# Patient Record
Sex: Female | Born: 1960
Health system: Southern US, Community
[De-identification: ages and names within clinical notes are randomized; demographics above are authoritative.]

## PROBLEM LIST (undated history)

## (undated) DIAGNOSIS — I10 Essential (primary) hypertension: Secondary | ICD-10-CM

## (undated) DIAGNOSIS — J302 Other seasonal allergic rhinitis: Secondary | ICD-10-CM

## (undated) DIAGNOSIS — N83209 Unspecified ovarian cyst, unspecified side: Secondary | ICD-10-CM

## (undated) DIAGNOSIS — E039 Hypothyroidism, unspecified: Secondary | ICD-10-CM

## (undated) DIAGNOSIS — T4145XA Adverse effect of unspecified anesthetic, initial encounter: Secondary | ICD-10-CM

## (undated) DIAGNOSIS — R112 Nausea with vomiting, unspecified: Secondary | ICD-10-CM

## (undated) DIAGNOSIS — Z9889 Other specified postprocedural states: Secondary | ICD-10-CM

## (undated) DIAGNOSIS — Z809 Family history of malignant neoplasm, unspecified: Secondary | ICD-10-CM

## (undated) DIAGNOSIS — N993 Prolapse of vaginal vault after hysterectomy: Secondary | ICD-10-CM

## (undated) DIAGNOSIS — E079 Disorder of thyroid, unspecified: Secondary | ICD-10-CM

## (undated) DIAGNOSIS — T8859XA Other complications of anesthesia, initial encounter: Secondary | ICD-10-CM

## (undated) DIAGNOSIS — R350 Frequency of micturition: Secondary | ICD-10-CM

## (undated) DIAGNOSIS — Z973 Presence of spectacles and contact lenses: Secondary | ICD-10-CM

## (undated) DIAGNOSIS — N8110 Cystocele, unspecified: Secondary | ICD-10-CM

## (undated) DIAGNOSIS — E876 Hypokalemia: Secondary | ICD-10-CM

## (undated) DIAGNOSIS — M1990 Unspecified osteoarthritis, unspecified site: Secondary | ICD-10-CM

## (undated) DIAGNOSIS — T7840XA Allergy, unspecified, initial encounter: Secondary | ICD-10-CM

## (undated) HISTORY — DX: Family history of malignant neoplasm, unspecified: Z80.9

## (undated) HISTORY — DX: Hypokalemia: E87.6

## (undated) HISTORY — DX: Allergy, unspecified, initial encounter: T78.40XA

## (undated) HISTORY — PX: TONSILLECTOMY: SUR1361

## (undated) HISTORY — DX: Essential (primary) hypertension: I10

## (undated) HISTORY — DX: Unspecified ovarian cyst, unspecified side: N83.209

## (undated) HISTORY — DX: Disorder of thyroid, unspecified: E07.9

---

## 1985-10-09 HISTORY — PX: PILONIDAL CYST EXCISION: SHX744

## 2007-02-12 ENCOUNTER — Encounter: Admission: RE | Admit: 2007-02-12 | Discharge: 2007-02-12 | Payer: Self-pay | Admitting: *Deleted

## 2007-09-22 ENCOUNTER — Emergency Department (HOSPITAL_COMMUNITY): Admission: EM | Admit: 2007-09-22 | Discharge: 2007-09-22 | Payer: Self-pay | Admitting: Emergency Medicine

## 2009-04-17 ENCOUNTER — Emergency Department: Payer: Self-pay | Admitting: Emergency Medicine

## 2014-03-17 ENCOUNTER — Other Ambulatory Visit: Payer: Self-pay | Admitting: Family

## 2014-03-17 ENCOUNTER — Encounter (INDEPENDENT_AMBULATORY_CARE_PROVIDER_SITE_OTHER): Payer: Self-pay

## 2014-03-17 ENCOUNTER — Ambulatory Visit
Admission: RE | Admit: 2014-03-17 | Discharge: 2014-03-17 | Disposition: A | Discharge status: Home / Self Care | Payer: No Typology Code available for payment source | Source: Ambulatory Visit | Attending: Family | Admitting: Family

## 2014-03-17 DIAGNOSIS — M25519 Pain in unspecified shoulder: Secondary | ICD-10-CM

## 2015-12-02 MED FILL — HYDROCHLOROTHIAZIDE 25 MG T: 25 | 90 days supply | Qty: 90 | Fill #3

## 2015-12-21 DIAGNOSIS — H52203 Unspecified astigmatism, bilateral: Secondary | ICD-10-CM | POA: Diagnosis not present

## 2015-12-21 DIAGNOSIS — H524 Presbyopia: Secondary | ICD-10-CM | POA: Diagnosis not present

## 2015-12-21 DIAGNOSIS — H5213 Myopia, bilateral: Secondary | ICD-10-CM | POA: Diagnosis not present

## 2016-01-06 DIAGNOSIS — Z1231 Encounter for screening mammogram for malignant neoplasm of breast: Secondary | ICD-10-CM | POA: Diagnosis not present

## 2016-01-06 DIAGNOSIS — Z6837 Body mass index (BMI) 37.0-37.9, adult: Secondary | ICD-10-CM | POA: Diagnosis not present

## 2016-01-06 DIAGNOSIS — Z01419 Encounter for gynecological examination (general) (routine) without abnormal findings: Secondary | ICD-10-CM | POA: Diagnosis not present

## 2016-01-06 MED FILL — ENALAPRIL MALEATE 10 MG TAB: 10 | 90 days supply | Qty: 90 | Fill #1

## 2016-02-10 DIAGNOSIS — R7301 Impaired fasting glucose: Secondary | ICD-10-CM | POA: Diagnosis not present

## 2016-02-10 DIAGNOSIS — I10 Essential (primary) hypertension: Secondary | ICD-10-CM | POA: Diagnosis not present

## 2016-02-17 DIAGNOSIS — R7301 Impaired fasting glucose: Secondary | ICD-10-CM | POA: Diagnosis not present

## 2016-02-17 DIAGNOSIS — I10 Essential (primary) hypertension: Secondary | ICD-10-CM | POA: Diagnosis not present

## 2016-05-03 MED FILL — ENALAPRIL MALEATE 10 MG TAB: 10 | 90 days supply | Qty: 90 | Fill #0

## 2016-05-25 MED FILL — HYDROCHLOROTHIAZIDE 25 MG T: 25 | 90 days supply | Qty: 90 | Fill #0

## 2016-08-10 DIAGNOSIS — Z23 Encounter for immunization: Secondary | ICD-10-CM | POA: Diagnosis not present

## 2016-08-10 DIAGNOSIS — I10 Essential (primary) hypertension: Secondary | ICD-10-CM | POA: Diagnosis not present

## 2016-08-10 DIAGNOSIS — M25561 Pain in right knee: Secondary | ICD-10-CM | POA: Diagnosis not present

## 2016-08-10 DIAGNOSIS — R7301 Impaired fasting glucose: Secondary | ICD-10-CM | POA: Diagnosis not present

## 2016-08-18 MED FILL — MELOXICAM 15 MG TABLET: 15 | 14 days supply | Qty: 14 | Fill #0

## 2016-08-18 MED FILL — ENALAPRIL MALEATE 10 MG TAB: 10 | 90 days supply | Qty: 90 | Fill #0

## 2016-09-08 DIAGNOSIS — J069 Acute upper respiratory infection, unspecified: Secondary | ICD-10-CM | POA: Diagnosis not present

## 2016-09-08 DIAGNOSIS — B9789 Other viral agents as the cause of diseases classified elsewhere: Secondary | ICD-10-CM | POA: Diagnosis not present

## 2016-09-08 MED FILL — BENZONATATE 100 MG CAPSULE: 100 | 10 days supply | Qty: 30 | Fill #0

## 2016-09-20 MED FILL — HYDROCHLOROTHIAZIDE 25 MG T: 25 | 90 days supply | Qty: 90 | Fill #1

## 2016-11-27 MED FILL — ENALAPRIL MALEATE 10 MG TAB: 10 | 90 days supply | Qty: 90 | Fill #1

## 2017-01-02 DIAGNOSIS — H52203 Unspecified astigmatism, bilateral: Secondary | ICD-10-CM | POA: Diagnosis not present

## 2017-01-02 DIAGNOSIS — H524 Presbyopia: Secondary | ICD-10-CM | POA: Diagnosis not present

## 2017-01-02 DIAGNOSIS — H5213 Myopia, bilateral: Secondary | ICD-10-CM | POA: Diagnosis not present

## 2017-01-17 MED FILL — HYDROCHLOROTHIAZIDE 25 MG T: 25 | 90 days supply | Qty: 90 | Fill #2

## 2017-01-19 DIAGNOSIS — Z6839 Body mass index (BMI) 39.0-39.9, adult: Secondary | ICD-10-CM | POA: Diagnosis not present

## 2017-01-19 DIAGNOSIS — Z01419 Encounter for gynecological examination (general) (routine) without abnormal findings: Secondary | ICD-10-CM | POA: Diagnosis not present

## 2017-01-19 DIAGNOSIS — Z1231 Encounter for screening mammogram for malignant neoplasm of breast: Secondary | ICD-10-CM | POA: Diagnosis not present

## 2017-02-08 DIAGNOSIS — Z0000 Encounter for general adult medical examination without abnormal findings: Secondary | ICD-10-CM | POA: Diagnosis not present

## 2017-02-08 DIAGNOSIS — R7301 Impaired fasting glucose: Secondary | ICD-10-CM | POA: Diagnosis not present

## 2017-03-22 MED FILL — ENALAPRIL MALEATE 10 MG TAB: 10 | 90 days supply | Qty: 90 | Fill #2

## 2017-05-30 MED FILL — HYDROCHLOROTHIAZIDE 25 MG T: 25 | 90 days supply | Qty: 90 | Fill #0

## 2017-07-26 MED FILL — ENALAPRIL MALEATE 10 MG TAB: 10 | 90 days supply | Qty: 90 | Fill #3

## 2017-10-15 ENCOUNTER — Ambulatory Visit (INDEPENDENT_AMBULATORY_CARE_PROVIDER_SITE_OTHER): Payer: No Typology Code available for payment source | Admitting: Physician Assistant

## 2017-10-15 ENCOUNTER — Other Ambulatory Visit: Payer: Self-pay

## 2017-10-15 ENCOUNTER — Encounter: Payer: Self-pay | Admitting: Physician Assistant

## 2017-10-15 VITALS — BP 128/86 | HR 78 | Temp 98.0°F | Resp 16 | Ht 62.0 in | Wt 233.0 lb

## 2017-10-15 DIAGNOSIS — Z23 Encounter for immunization: Secondary | ICD-10-CM

## 2017-10-15 DIAGNOSIS — R600 Localized edema: Secondary | ICD-10-CM

## 2017-10-15 DIAGNOSIS — I10 Essential (primary) hypertension: Secondary | ICD-10-CM | POA: Diagnosis not present

## 2017-10-15 DIAGNOSIS — Z0000 Encounter for general adult medical examination without abnormal findings: Secondary | ICD-10-CM | POA: Diagnosis not present

## 2017-10-15 DIAGNOSIS — N83209 Unspecified ovarian cyst, unspecified side: Secondary | ICD-10-CM | POA: Diagnosis not present

## 2017-10-15 DIAGNOSIS — N814 Uterovaginal prolapse, unspecified: Secondary | ICD-10-CM

## 2017-10-15 LAB — BASIC METABOLIC PANEL WITH GFR
BUN: 18 mg/dL (ref 7–25)
CHLORIDE: 103 mmol/L (ref 98–110)
CO2: 30 mmol/L (ref 20–32)
Calcium: 9.2 mg/dL (ref 8.6–10.4)
Creat: 0.73 mg/dL (ref 0.50–1.05)
GFR, EST NON AFRICAN AMERICAN: 93 mL/min/{1.73_m2} (ref >=60)
GFR, Est African American: 108 mL/min/{1.73_m2} (ref >=60)
GLUCOSE: 90 mg/dL (ref 65–99)
POTASSIUM: 3.7 mmol/L (ref 3.5–5.3)
SODIUM: 140 mmol/L (ref 135–146)

## 2017-10-15 LAB — EXTRA LAV TOP TUBE

## 2017-10-15 MED ORDER — ENALAPRIL MALEATE 10 MG PO TABS: 10.0000 mg | ORAL_TABLET | Freq: Every day | ORAL | 2 refills | Status: DC

## 2017-10-15 MED ORDER — HYDROCHLOROTHIAZIDE 25 MG PO TABS: 25.0000 mg | ORAL_TABLET | Freq: Every day | ORAL | 2 refills | Status: DC

## 2017-10-15 MED FILL — HYDROCHLOROTHIAZIDE 25 MG T: 25 | 90 days supply | Qty: 90 | Fill #0

## 2017-10-15 MED FILL — ENALAPRIL MALEATE 10 MG TAB: 10 | 90 days supply | Qty: 90 | Fill #0

## 2017-10-15 NOTE — Progress Notes (Signed)
Patient ID: Kimberly Wall MRN: [id number], DOB: Jun 15, 1961, 57 y.o. Date of Encounter: @DATE @  Chief Complaint:  Chief Complaint  Patient presents with  . New Patient (Initial Visit)    HPI: 57 y.o. year old female  presents as a New Patient to Establish Care.  She states that her husband was already a patient here so that is one reason she is establishing care at our office.   States that she had been going to [facility] for her PCP.   Says that she would go there just once a year and then just for acute care visits.   States that they were prescribing her blood pressure medication and her medication for fluid retention.  Reviewed with her that I can see some information in the computer.  States that she was going to a gynecologist but they do not accept her current insurance so she needs referral to a different gynecologist.   States that they have been managing uterine prolapse and cyst on right ovary.  States that she also goes to [facility].  States that she wore glasses when she was younger and now wears contacts and goes to [facility] once a year.  She sees no other specialists.  Has no other significant known medical history. No specific concerns to address.  Just needs refill on her 2 medications and to get established.   Her husband is a Software engineer. She does work but is able to work from home. Reports that they have 3 children ages 55, 68, 61.  Reports that they are all living at home currently.     Past Medical History:  Diagnosis Date  . Allergy    seasonal  . Hypertension      Home Meds: Outpatient Medications Prior to Visit  Medication Sig Dispense Refill  . enalapril (VASOTEC) 10 MG tablet Take 10 mg by mouth daily.    . hydrochlorothiazide (HYDRODIURIL) 25 MG tablet Take 25 mg by mouth daily.     No facility-administered medications prior to visit.     Allergies: No Known Allergies  Social History   Socioeconomic History  . Marital  status: Married    Spouse name: Not on file  . Number of children: Not on file  . Years of education: Not on file  . Highest education level: Not on file  Social Needs  . Financial resource strain: Not on file  . Food insecurity - worry: Not on file  . Food insecurity - inability: Not on file  . Transportation needs - medical: Not on file  . Transportation needs - non-medical: Not on file  Occupational History  . Not on file  Tobacco Use  . Smoking status: Never Smoker  . Smokeless tobacco: Never Used  Substance and Sexual Activity  . Alcohol use: Yes    Comment: occassional  . Drug use: No  . Sexual activity: Not on file  Other Topics Concern  . Not on file  Social History Narrative  . Not on file    Family History  Problem Relation Age of Onset  . Arthritis Mother   . Cancer Mother   . Varicose Veins Mother   . Arthritis Father   . Diabetes Father   . Hearing loss Father   . Heart disease Father   . Hypertension Father   . Vision loss Father   . Cancer Maternal Grandmother   . Varicose Veins Maternal Grandmother   . Stroke Maternal Grandfather   .  Stroke Paternal Grandfather      Review of Systems:  See HPI for pertinent ROS. All other ROS negative.    Physical Exam: Blood pressure 128/86, pulse 78, temperature 98 F (36.7 C), temperature source Oral, resp. rate 16, height 5\' 2"  (1.575 m), weight 105.7 kg (233 lb), last menstrual period 11/15/2016, SpO2 98 %., Body mass index is 42.62 kg/m. General:  WF. Appears in no acute distress. Neck: Supple. No thyromegaly. No lymphadenopathy. No carotid bruit. Lungs: Clear bilaterally to auscultation without wheezes, rales, or rhonchi. Breathing is unlabored. Heart: RRR with S1 S2. No murmurs, rubs, or gallops. Musculoskeletal:  Strength and tone normal for age. Extremities/Skin: Warm and dry. No LE edema. Neuro: Alert and oriented X 3. Moves all extremities spontaneously. Gait is normal. CNII-XII grossly in  tact. Psych:  Responds to questions appropriately with a normal affect.     ASSESSMENT AND PLAN:  57 y.o. year old female with  1. Encounter for medical examination to establish care  2. Essential hypertension Blood Pressure is at goal/controlled.  Check lab to monitor.  Continue current medications. - BASIC METABOLIC PANEL WITH GFR - hydrochlorothiazide (HYDRODIURIL) 25 MG tablet; Take 1 tablet (25 mg total) by mouth daily.  Dispense: 90 tablet; Refill: 2 - enalapril (VASOTEC) 10 MG tablet; Take 1 tablet (10 mg total) by mouth daily.  Dispense: 90 tablet; Refill: 2  3. Bilateral lower extremity edema Her lower extremity edema is well controlled with current dose of HCTZ.  Continue this current medication the same.  Check lab to monitor. - BASIC METABOLIC PANEL WITH GFR - hydrochlorothiazide (HYDRODIURIL) 25 MG tablet; Take 1 tablet (25 mg total) by mouth daily.  Dispense: 90 tablet; Refill: 2  4. Cyst of ovary, unspecified laterality Refer to a gynecologist who is covered with her new insurance plan. - Ambulatory referral to Gynecology  5. Uterine prolapse Refer to a gynecologist who is covered with her new insurance plan. - Ambulatory referral to Gynecology  Next visit will verify whether she has had complete physical exam recently and make sure the preventive care is up-to-date. Today she reports that she knows that she did have a tetanus vaccine at [facility] and knows that she had a colonoscopy with [facility].  She states that she will obtain those records. Will f/u regarding Preventive Care once records are obtained.  Signed, [address] [place], [place], [facility] 10/15/2017 11:11 AM

## 2017-10-24 ENCOUNTER — Encounter: Payer: Self-pay | Admitting: Physician Assistant

## 2017-11-23 ENCOUNTER — Telehealth: Payer: Self-pay | Admitting: Obstetrics and Gynecology

## 2017-11-23 NOTE — Telephone Encounter (Signed)
Called and left a message for patient to call back to schedule a new patient doctor referral appointment with our office for uterine prolapse and cyst of ovary.

## 2017-11-26 ENCOUNTER — Encounter: Payer: Self-pay | Admitting: Obstetrics and Gynecology

## 2017-11-26 ENCOUNTER — Ambulatory Visit: Payer: No Typology Code available for payment source | Admitting: Obstetrics and Gynecology

## 2017-11-26 ENCOUNTER — Other Ambulatory Visit: Payer: Self-pay

## 2017-11-26 ENCOUNTER — Ambulatory Visit (INDEPENDENT_AMBULATORY_CARE_PROVIDER_SITE_OTHER): Payer: No Typology Code available for payment source | Admitting: Obstetrics and Gynecology

## 2017-11-26 ENCOUNTER — Other Ambulatory Visit (HOSPITAL_COMMUNITY)
Admission: RE | Admit: 2017-11-26 | Discharge: 2017-11-26 | Disposition: A | Discharge status: Home / Self Care | Payer: No Typology Code available for payment source | Source: Ambulatory Visit | Attending: Obstetrics and Gynecology | Admitting: Obstetrics and Gynecology

## 2017-11-26 VITALS — BP 138/82 | HR 72 | Resp 16 | Ht 62.75 in | Wt 234.0 lb

## 2017-11-26 DIAGNOSIS — Z01419 Encounter for gynecological examination (general) (routine) without abnormal findings: Secondary | ICD-10-CM

## 2017-11-26 DIAGNOSIS — N813 Complete uterovaginal prolapse: Secondary | ICD-10-CM | POA: Diagnosis not present

## 2017-11-26 DIAGNOSIS — Z86018 Personal history of other benign neoplasm: Secondary | ICD-10-CM

## 2017-11-26 DIAGNOSIS — N951 Menopausal and female climacteric states: Secondary | ICD-10-CM | POA: Diagnosis not present

## 2017-11-26 DIAGNOSIS — Z803 Family history of malignant neoplasm of breast: Secondary | ICD-10-CM | POA: Diagnosis not present

## 2017-11-26 NOTE — Progress Notes (Signed)
57 y.o. G49P0003 Married Caucasian female here for annual exam and uterine prolapse; patient was referred from PCP.   Slow voiding less than once a week.  No bladder infections.  Does feel a bulge with wiping.   Having BMs.   No menses since March 2018 and then had cycle last week.  Menses prior to March 2018 were monthly but could be lighter, heavier, shorter, or longer.  Hx fibroids.  Hot flashes are not consistent.   FH of breast cancer - maternal grandmother, and 2 maternal first cousins.  No FH uterus, ovarian, or colon cancer.  Wants genetic counseling.   Works from home.  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 Masters in counseling.   PCP:  Dena Billet, PA-C   Patient's last menstrual period was 11/13/2017.           Sexually active: Yes.    The current method of family planning is vasectomy.    Exercising: No.  The patient does not participate in regular exercise at present. Smoker:  no  Health Maintenance: Pap:  About 2017 normal per patient -- done at [facility] History of abnormal Pap:  no MMG:  About 2017 done at [facility] Colonoscopy:  2014 - normal per patient polyps removed BMD:   n/a  Result  n/a TDaP:  unsure Gardasil:   n/a HIV: donated blood in the past  Hep C: donated blood in the past Screening Labs: discuss today   reports that  has never smoked. she has never used smokeless tobacco. She reports that she drinks alcohol. She reports that she does not use drugs.  Past Medical History:  Diagnosis Date  . Allergy    seasonal  . Hypertension   . Ovarian cyst     Past Surgical History:  Procedure Laterality Date  . CYSTECTOMY  1987    Current Outpatient Medications  Medication Sig Dispense Refill  . enalapril (VASOTEC) 10 MG tablet Take 1 tablet (10 mg total) by mouth daily. 90 tablet 2  . hydrochlorothiazide (HYDRODIURIL) 25 MG tablet Take 1 tablet (25 mg total) by mouth daily. 90 tablet 2    No current facility-administered medications for this visit.     Family History  Problem Relation Age of Onset  . Arthritis Mother   . Cancer Mother        glioblastoma  . Varicose Veins Mother   . Arthritis Father   . Diabetes Father   . Hearing loss Father   . Heart disease Father   . Hypertension Father   . Vision loss Father   . Cancer Maternal Grandmother        throat  . Varicose Veins Maternal Grandmother   . Stroke Maternal Grandfather   . Stroke Paternal Grandfather   . Thyroid disease Sister   . Cancer Maternal Aunt        breast    ROS:  Pertinent items are noted in HPI.  Otherwise, a comprehensive ROS was negative.  Exam:   BP 138/82 (BP Location: Right Arm, Patient Position: Sitting, Cuff Size: Large)   Pulse 72   Resp 16   Ht 5' 2.75" (1.594 m)   Wt 234 lb (106.1 kg)   LMP 11/13/2017   BMI 41.78 kg/m     General appearance: alert, cooperative and appears stated age Head: Normocephalic, without obvious abnormality, atraumatic Neck: no adenopathy, supple, symmetrical, trachea midline and thyroid normal to inspection and palpation Lungs: clear to  auscultation bilaterally Breasts: normal appearance, no masses or tenderness, No nipple retraction or dimpling, No nipple discharge or bleeding, No axillary or supraclavicular adenopathy Heart: regular rate and rhythm Abdomen: soft, non-tender; no masses, no organomegaly Extremities: extremities normal, atraumatic, no cyanosis or edema Skin: Skin color, texture, turgor normal. No rashes or lesions Lymph nodes: Cervical, supraclavicular, and axillary nodes normal. No abnormal inguinal nodes palpated Neurologic: Grossly normal  Pelvic: External genitalia:  no lesions              Urethra:  normal appearing urethra with no masses, tenderness or lesions              Bartholins and Skenes: normal                 Vagina: normal appearing vagina with normal color and discharge, no lesions.  Third degree uterine  prolapse.                Cervix: no lesions              Pap taken: Yes.   Bimanual Exam:  Uterus:  10 week size (difficult to feel due to body habitus), mobile and non-tender              Adnexa: no mass, fullness, tenderness              Rectal exam: Yes.  .  Confirms.              Anus:  normal sphincter tone, no lesions  Chaperone was present for exam.  Assessment:   Well woman visit with normal exam. Perimenopausal female.  Third degree uterine prolapse.  History of fibroids and ovarian cyst. FH breast cancer.   Plan: Mammogram screening discussed.  Discussed 3D.  Information for [facility] given to patient.  Recommended self breast awareness. Pap and HR HPV as above. Guidelines for Calcium, Vitamin D, regular exercise program including cardiovascular and weight bearing exercise. FSH, estradiol. Routine labs with PCP.  Return for pelvic ultrasound.  We discussed care for her uterovaginal prolapse - observation, pessary, or surgical care - hysterectomy with vaginal vault suspension.  Will need urodynamic testing if she wishes to pursue surgical care.  Follow up annually and prn.   After visit summary provided.

## 2017-11-26 NOTE — Patient Instructions (Signed)

## 2017-11-27 LAB — FOLLICLE STIMULATING HORMONE: FSH: 30.4 m[IU]/mL

## 2017-11-27 LAB — ESTRADIOL: Estradiol: 11.1 pg/mL

## 2017-11-28 LAB — CYTOLOGY - PAP
DIAGNOSIS: NEGATIVE
HPV (WINDOPATH): NOT DETECTED

## 2017-11-29 ENCOUNTER — Telehealth: Payer: Self-pay | Admitting: Obstetrics and Gynecology

## 2017-11-29 ENCOUNTER — Telehealth: Payer: Self-pay

## 2017-11-29 NOTE — Telephone Encounter (Signed)
Spoke with patient. Results given. Patient verbalizes understanding. Appointment for PUS scheduled for 12/13/2017 at 8 am with 8:30 am consult with Dr.Silva. Patient verbalizes understanding. 02 recall placed.   Routing to provider for final review. Patient agreeable to disposition. Will close encounter.

## 2017-11-29 NOTE — Telephone Encounter (Signed)
Call placed to patient to review benefits for a scheduled ultrasound. Left voicemail message requesting a return call.

## 2017-11-29 NOTE — Telephone Encounter (Signed)
-----   Message from Nunzio Cobbs, MD sent at 11/28/2017  5:30 PM EST ----- Please contact patient with results.  Her [place] and estradiol levels look closer to menopause than premenopause.  I do recommend she proceed with a pelvic ultrasound.  Please schedule this with me.  I have already place an order.  Patient knows to expect this.  Her pap and HR HPV are normal and negative.  Recall - 02.

## 2017-11-29 NOTE — Telephone Encounter (Signed)
Patient returned call. Spoke with patient regarding benefit for ultrasound. Patient understood and agreeable. Patient scheduled 12/13/17 with Dr Quincy Simmonds. Patient aware of appointment date, arrival time and cancellation policy. No further questions. Ok to close

## 2017-12-03 ENCOUNTER — Telehealth: Payer: Self-pay | Admitting: Genetic Counselor

## 2017-12-03 ENCOUNTER — Encounter: Payer: Self-pay | Admitting: Genetic Counselor

## 2017-12-03 NOTE — Telephone Encounter (Signed)
10:32 am spoke with patient to schedule Genetic counseling appt. With Steele Berg for 12/20/17 @ 8:00 a.m.  Patient is aware to arrive 30 mins early and bring ID, insurance card, and list of meds.

## 2017-12-13 ENCOUNTER — Encounter: Payer: Self-pay | Admitting: Obstetrics and Gynecology

## 2017-12-13 ENCOUNTER — Ambulatory Visit (INDEPENDENT_AMBULATORY_CARE_PROVIDER_SITE_OTHER): Payer: No Typology Code available for payment source

## 2017-12-13 ENCOUNTER — Other Ambulatory Visit: Payer: Self-pay

## 2017-12-13 ENCOUNTER — Ambulatory Visit: Payer: No Typology Code available for payment source | Admitting: Obstetrics and Gynecology

## 2017-12-13 VITALS — BP 118/80 | HR 80 | Resp 16 | Wt 234.0 lb

## 2017-12-13 DIAGNOSIS — D219 Benign neoplasm of connective and other soft tissue, unspecified: Secondary | ICD-10-CM | POA: Diagnosis not present

## 2017-12-13 DIAGNOSIS — N813 Complete uterovaginal prolapse: Secondary | ICD-10-CM | POA: Diagnosis not present

## 2017-12-13 DIAGNOSIS — N950 Postmenopausal bleeding: Secondary | ICD-10-CM | POA: Diagnosis not present

## 2017-12-13 DIAGNOSIS — Z86018 Personal history of other benign neoplasm: Secondary | ICD-10-CM

## 2017-12-13 NOTE — Progress Notes (Signed)
GYNECOLOGY  VISIT   HPI: 57 y.o.   Married  Caucasian  female   G3P0003 with Patient's last menstrual period was 11/13/2017.   here for ultrasound.  Has known fibroid.  Has third degree uterine prolapse, urinary incontinence, and postmenopausal bleeding.  Last prior LMP was March 2018.  Labs on 11/26/17: [place] 30.4 E2 11.1  GYNECOLOGIC HISTORY: Patient's last menstrual period was 11/13/2017. Contraception:  Vasectomy Menopausal hormone therapy:  none Last mammogram:  About 2017 per patient done at [facility] Last pap smear:   11/26/17 Pap and HR HPV negative        OB History    Gravida Para Term Preterm AB Living   3 3 0 0 0 3   SAB TAB Ectopic Multiple Live Births   0 0 0 0 0         Patient Active Problem List   Diagnosis Date Noted  . Essential hypertension 10/15/2017  . Bilateral lower extremity edema 10/15/2017    Past Medical History:  Diagnosis Date  . Allergy    seasonal  . Hypertension   . Ovarian cyst     Past Surgical History:  Procedure Laterality Date  . CYSTECTOMY  1987    Current Outpatient Medications  Medication Sig Dispense Refill  . enalapril (VASOTEC) 10 MG tablet Take 1 tablet (10 mg total) by mouth daily. 90 tablet 2  . hydrochlorothiazide (HYDRODIURIL) 25 MG tablet Take 1 tablet (25 mg total) by mouth daily. 90 tablet 2   No current facility-administered medications for this visit.      ALLERGIES: Patient has no known allergies.  Family History  Problem Relation Age of Onset  . Arthritis Mother   . Cancer Mother        glioblastoma  . Varicose Veins Mother   . Arthritis Father   . Diabetes Father   . Hearing loss Father   . Heart disease Father   . Hypertension Father   . Vision loss Father   . Cancer Maternal Grandmother        throat  . Varicose Veins Maternal Grandmother   . Stroke Maternal Grandfather   . Stroke Paternal Grandfather   . Thyroid disease Sister   . Cancer Maternal Aunt        breast     Social History   Socioeconomic History  . Marital status: Married    Spouse name: Not on file  . Number of children: Not on file  . Years of education: Not on file  . Highest education level: Not on file  Social Needs  . Financial resource strain: Not on file  . Food insecurity - worry: Not on file  . Food insecurity - inability: Not on file  . Transportation needs - medical: Not on file  . Transportation needs - non-medical: Not on file  Occupational History  . Not on file  Tobacco Use  . Smoking status: Never Smoker  . Smokeless tobacco: Never Used  Substance and Sexual Activity  . Alcohol use: Yes    Comment: occassional  . Drug use: No  . Sexual activity: Yes    Birth control/protection: Other-see comments    Comment: Husband had Vasectomy  Other Topics Concern  . Not on file  Social History Narrative  . Not on file    ROS:  Pertinent items are noted in HPI.  PHYSICAL EXAMINATION:    BP 118/80 (BP Location: Right Arm, Patient Position: Sitting, Cuff Size: Large)  Pulse 80   Resp 16   Wt 234 lb (106.1 kg)   LMP 11/13/2017   BMI 41.78 kg/m     General appearance: alert, cooperative and appears stated age      Pelvic: External genitalia:  no lesions              Urethra:  normal appearing urethra with no masses, tenderness or lesions              Bartholins and Skenes: normal                 Vagina: normal appearing vagina with normal color and discharge, no lesions              Cervix: no lesions                Bimanual Exam:  Uterus:  normal size, contour, position, consistency, mobility, non-tender              Adnexa: no mass, fullness, tenderness     Pelvic US: Subserosal fibroid 10/5 x 6 x 6 cm. EMS - 5.81 mm Ovaries normal.  Right ovary with 9 mm follicle.  No free fluid.  EMB Consent for procedure.  Hibiclens prep.  Tenaculum to ant. Cervical lip. Pipelle to 9 cm twice.  Tissue to pathology. Minimal EBL.  No  complications.  Chaperone was present for exam.  ASSESSMENT  Postmenopausal bleeding.  Large subserosal fibroid.  Complete uterovaginal prolapse.  Stress incontinence.   PLAN  FU EMB.  Instructions given.  I discussed her fibroid, prolapse, and PMB.  I outlined a potential plan for a robotic TLH/bilateral salpingectomy, sacrocolpopexy, and TVT midurethral sling/cysto.  Written information given. Would need urodynamic testing done if wishes to proceed with surgical reconstruction.  She would need morcellation in an Alexis bag done vaginally.    An After Visit Summary was printed and given to the patient.  __15____ minutes face to face time of which over 50% was spent in counseling.

## 2017-12-13 NOTE — Patient Instructions (Signed)

## 2017-12-13 NOTE — Progress Notes (Signed)
Encounter reviewed by Dr. Brook Amundson C. Silva.  

## 2017-12-18 ENCOUNTER — Telehealth: Payer: Self-pay | Admitting: *Deleted

## 2017-12-18 NOTE — Telephone Encounter (Signed)
Call to patient. Notified of biopsy results as instructed by Dr Quincy Simmonds. Discussion of urodynamic testing and surgical planning.  Patient will wait for call from business office regarding benefits and consider recovery period.

## 2017-12-18 NOTE — Telephone Encounter (Signed)
-----   Message from Nunzio Cobbs, MD sent at 12/17/2017  5:18 PM EDT ----- Please inform patient of her EMB showing benign endometrium.  No abnormal cells were seen.  We talked about potential robotic total laparoscopic hysterectomy with sacrocolpopexy for a prolapse repair.  We may need to coordinate care further for her.  If she would like to have surgery, she will need urodynamic testing.   Cc- Lamont Snowball

## 2017-12-20 ENCOUNTER — Other Ambulatory Visit: Payer: No Typology Code available for payment source

## 2017-12-24 NOTE — Telephone Encounter (Signed)
Spoke with patient regarding benefit for urodynamic testing. Patient understood and agreeable. Patient ready to schedule. Patient scheduled on 01/01/18. Patient aware of appointment date, arrival time and cancellation policy.   Patient is aware a nurse will call her back to go over pre-testing instructions.  Routing to [employer], RN  cc: Lamont Snowball, RN  cc: Karmen Bongo, RN

## 2017-12-25 NOTE — Telephone Encounter (Signed)
Message left to return call to [facility] at [phone] to review Urodynamics Instructions.

## 2017-12-27 NOTE — Telephone Encounter (Signed)
Call to patient. Urodynamics information sheet reviewed with patient and she verbalized understanding. Nurse visit for urinalysis prior to urodynamics scheduled for Friday 12/28/17 at 0830. Patient agreeable to date and time of appointment. One week consult scheduled for Wednesday 01/09/18 at 1530. Patient agreeable to date and time of appointment. Patient aware urodynamics information sheet will be given to her at her appointment on 12/28/17.   Routing to provider for final review. Patient agreeable to disposition. Will close encounter.   CC Lamont Snowball, RN

## 2017-12-28 ENCOUNTER — Ambulatory Visit (INDEPENDENT_AMBULATORY_CARE_PROVIDER_SITE_OTHER): Payer: No Typology Code available for payment source

## 2017-12-28 VITALS — BP 138/88 | HR 76 | Ht 62.75 in | Wt 234.0 lb

## 2017-12-28 DIAGNOSIS — R32 Unspecified urinary incontinence: Secondary | ICD-10-CM | POA: Diagnosis not present

## 2017-12-28 LAB — POCT URINALYSIS DIPSTICK
Bilirubin, UA: NEGATIVE
Blood, UA: NEGATIVE
Glucose, UA: NEGATIVE
KETONES UA: NEGATIVE
Leukocytes, UA: NEGATIVE
NITRITE UA: NEGATIVE
PROTEIN UA: NEGATIVE
UROBILINOGEN UA: 0.2 U/dL
pH, UA: 5 (ref 5.0–8.0)

## 2017-12-28 NOTE — Progress Notes (Signed)
Patient here today for urinalysis prior to urodynamics testing.   Urine Dip:Neg

## 2018-01-01 ENCOUNTER — Other Ambulatory Visit: Payer: Self-pay | Admitting: Obstetrics and Gynecology

## 2018-01-01 ENCOUNTER — Ambulatory Visit (INDEPENDENT_AMBULATORY_CARE_PROVIDER_SITE_OTHER): Payer: No Typology Code available for payment source | Admitting: Obstetrics and Gynecology

## 2018-01-01 VITALS — BP 138/83 | HR 74 | Wt 230.4 lb

## 2018-01-01 DIAGNOSIS — N393 Stress incontinence (female) (male): Secondary | ICD-10-CM

## 2018-01-01 DIAGNOSIS — N813 Complete uterovaginal prolapse: Secondary | ICD-10-CM | POA: Diagnosis not present

## 2018-01-01 MED ORDER — CIPROFLOXACIN HCL 250 MG PO TABS: 250.0000 mg | ORAL_TABLET | Freq: Two times a day (BID) | ORAL | 0 refills | Status: DC

## 2018-01-01 MED FILL — CIPROFLOXACIN HCL 250 MG TA: 250 | 1 days supply | Qty: 2 | Fill #0

## 2018-01-02 NOTE — Progress Notes (Signed)
Urodynamic procedure reviewed and verbal consent obtained. Procedure performed and no urinary leaking noted with valsalva maneuver or cough with 150 cc, 250 cc, and 386 cc of normal saline infused into the bladder. See [facility] report for full study details. Prophylactic antibiotics given following appointment by Dr.Silva. Cipro 250 mg BID x 2 doses sent to pharmacy on file. Patient verbalized understanding. Post procedure instructions reviewed. Follow-up appointment scheduled for 01/09/18 at 3:30 pm with Dr Quincy Simmonds.

## 2018-01-02 NOTE — Patient Instructions (Signed)
Follow up with Dr.Silva on 01/09/2018 at 3:30 pm.

## 2018-01-03 ENCOUNTER — Encounter: Payer: Self-pay | Admitting: Obstetrics and Gynecology

## 2018-01-09 ENCOUNTER — Encounter: Payer: Self-pay | Admitting: Obstetrics and Gynecology

## 2018-01-09 ENCOUNTER — Ambulatory Visit (INDEPENDENT_AMBULATORY_CARE_PROVIDER_SITE_OTHER): Payer: No Typology Code available for payment source | Admitting: Obstetrics and Gynecology

## 2018-01-09 ENCOUNTER — Other Ambulatory Visit: Payer: Self-pay

## 2018-01-09 VITALS — BP 126/80 | HR 76 | Resp 16 | Ht 62.75 in | Wt 234.0 lb

## 2018-01-09 DIAGNOSIS — D252 Subserosal leiomyoma of uterus: Secondary | ICD-10-CM | POA: Diagnosis not present

## 2018-01-09 DIAGNOSIS — N813 Complete uterovaginal prolapse: Secondary | ICD-10-CM

## 2018-01-09 DIAGNOSIS — R3915 Urgency of urination: Secondary | ICD-10-CM

## 2018-01-09 DIAGNOSIS — N950 Postmenopausal bleeding: Secondary | ICD-10-CM | POA: Diagnosis not present

## 2018-01-09 NOTE — Progress Notes (Signed)
GYNECOLOGY  VISIT   HPI: 57 y.o.   Married  Caucasian  female   [id number] with Patient's last menstrual period was 11/13/2017.here for consult for urodynamics results. Patient has complete uterovaginal prolapse, urinary urgency, large uterine fibroid and postmenopausal bleeding.  Husband is present for the entire visit today.    No leak of urine with cough, laugh, sneeze.  Does have urinary urgency.  Some fecal soiling.  No splinting.   Multichannel urodynamic testing with reduction of prolapse performed on 3/36/19. Uroflow:  Void 36 cc, PVR 10 cc. Continuous flow.  CMG:  S1 64 cc, S2 236 cc, S3 332 cc.    Max capacity 384.6 cc.             No VLPP.              Unstable CMG. UPP:    Unable to calculate UPP.  Pressure flow:  Max det pressure:  About 100 cm H2O.  Multiple bladder contractions noted.   Recent postmenopausal bleeding 11/13/17.  Prior LMP was March 2018. FSH 30.4 and estradiol 11.1 on 11/26/17. [place] 12/13/17 showed a large fibroid - 10.5 x 6.14 x 7.53 cm with vascular flow and cystic degeneration. Symmetrical endometrium.  Ovaries normal, and right ovary containing 9 mm follicle. No free fluid.  EMB benign endometrium with tubal metaplasia.  Has genetic counseling and potential testing scheduled for 01/14/18.  Just lost her job. mother in law living with patient and her husband and requires essentially 24 hour care.   GYNECOLOGIC HISTORY: Patient's last menstrual period was 11/13/2017. Contraception:  Vasectomy  Menopausal hormone therapy:  none Last mammogram:  About 2017 per patient done at [facility] pap smear:   11/26/17 pap and HR HPV negative        OB History    Gravida  3   Para  3   Term  0   Preterm  0   AB  0   Living  3     SAB  0   TAB  0   Ectopic  0   Multiple  0   Live Births  0              Patient Active Problem List   Diagnosis Date Noted  . Fibroid 12/13/2017  . Complete uterovaginal prolapse 12/13/2017  .  Essential hypertension 10/15/2017  . Bilateral lower extremity edema 10/15/2017    Past Medical History:  Diagnosis Date  . Allergy    seasonal  . Hypertension   . Ovarian cyst     Past Surgical History:  Procedure Laterality Date  . CYSTECTOMY  1987    Current Outpatient Medications  Medication Sig Dispense Refill  . enalapril (VASOTEC) 10 MG tablet Take 1 tablet (10 mg total) by mouth daily. 90 tablet 2  . hydrochlorothiazide (HYDRODIURIL) 25 MG tablet Take 1 tablet (25 mg total) by mouth daily. 90 tablet 2   No current facility-administered medications for this visit.      ALLERGIES: Patient has no known allergies.  Family History  Problem Relation Age of Onset  . Arthritis Mother   . Cancer Mother        glioblastoma  . Varicose Veins Mother   . Arthritis Father   . Diabetes Father   . Hearing loss Father   . Heart disease Father   . Hypertension Father   . Vision loss Father   . Cancer Maternal Grandmother  throat  . Varicose Veins Maternal Grandmother   . Stroke Maternal Grandfather   . Stroke Paternal Grandfather   . Thyroid disease Sister   . Cancer Maternal Aunt        breast    Social History   Socioeconomic History  . Marital status: Married    Spouse name: Not on file  . Number of children: Not on file  . Years of education: Not on file  . Highest education level: Not on file  Occupational History  . Not on file  Social Needs  . Financial resource strain: Not on file  . Food insecurity:    Worry: Not on file    Inability: Not on file  . Transportation needs:    Medical: Not on file    Non-medical: Not on file  Tobacco Use  . Smoking status: Never Smoker  . Smokeless tobacco: Never Used  Substance and Sexual Activity  . Alcohol use: Yes    Comment: occassional  . Drug use: No  . Sexual activity: Yes    Birth control/protection: Other-see comments    Comment: Husband had Vasectomy  Lifestyle  . Physical activity:    Days  per week: Not on file    Minutes per session: Not on file  . Stress: Not on file  Relationships  . Social connections:    Talks on phone: Not on file    Gets together: Not on file    Attends religious service: Not on file    Active member of club or organization: Not on file    Attends meetings of clubs or organizations: Not on file    Relationship status: Not on file  . Intimate partner violence:    Fear of current or ex partner: Not on file    Emotionally abused: Not on file    Physically abused: Not on file    Forced sexual activity: Not on file  Other Topics Concern  . Not on file  Social History Narrative  . Not on file    ROS:  Pertinent items are noted in HPI.  PHYSICAL EXAMINATION:    BP 126/80 (BP Location: Right Arm, Patient Position: Sitting, Cuff Size: Large)   Pulse 76   Resp 16   Ht 5' 2.75" (1.594 m)   Wt 234 lb (106.1 kg)   LMP 11/13/2017   BMI 41.78 kg/m     General appearance: alert, cooperative and appears stated age   Abdomen: soft, non-tender, no masses,  no organomegaly   Pelvic: External genitalia:  no lesions              Urethra:  normal appearing urethra with no masses, tenderness or lesions              Bartholins and Skenes: normal                 Vagina: normal appearing vagina with normal color and discharge, no lesions.  Third degree cystocele and third degree uterine prolapse, first degree rectocele.              Cervix: no lesions                Bimanual Exam:  Uterus:  normal size, contour, position, consistency, mobility, non-tender.  I feel fibroid posteriorly on examination.               Adnexa: no mass, fullness, tenderness  Rectal exam: Yes.  .  Confirms.              Anus:  normal sphincter tone, no lesions  Chaperone was present for exam.  ASSESSMENT  Complete uterovaginal prolapse. Urinary urgency.  No GSI by urodynamics. Postmenopausal bleeding.  Large uterine fibroid. High BMI.  Family history of  cancers.  PLAN  I have had a comprehensive discussion with the patient regarding prolapse and urinary incontinence and a discussion of pelvic anatomy using a 3D model.    I have provided reading materials from [employer] regarding prolapse and incontinence in general as well as medical and surgical treatment for these conditions.   We talked about observational management also.  We discussed surgical care options for hysterectomy combined with a sacrocolpopexy (abdominally or robotically), anterior and posterior colporrhaphy, TVT Exact midurethral sling and cystoscopy.  We discussed benefits and risks of surgery which include but are not limited to bleeding, infection, damage to surrounding organs, ureteral damage, permanent mesh use which may cause erosion and exposure in the vagina, urethra, bladder or ureters, slower voiding and urinary retention, overactive bladder symptoms, reoperation, recurrence of prolapse and incontinence,  DVT, PE, death, and reaction to anesthesia.    I have discussed surgical expectations regarding the procedures and success rates, outcomes, and recovery.      We discussed the rare possibility of sarcoma in the fibroid.   If she has a hysterectomy done laparoscopically, this will need to be morecellated in an [employer] vaginally. We will proceed with a pelvic MRI to evaluate her fibroid further.  She may then proceed with a pessary fitting.   She will complete genetic evaluation due to familial cancers.  An After Visit Summary was printed and given to the patient.  __40____ minutes face to face time of which over 50% was spent in counseling.

## 2018-01-14 ENCOUNTER — Inpatient Hospital Stay: Payer: No Typology Code available for payment source

## 2018-01-14 ENCOUNTER — Inpatient Hospital Stay: Payer: No Typology Code available for payment source | Attending: Genetic Counselor | Admitting: Genetic Counselor

## 2018-01-14 ENCOUNTER — Encounter: Payer: Self-pay | Admitting: Genetic Counselor

## 2018-01-14 DIAGNOSIS — Z808 Family history of malignant neoplasm of other organs or systems: Secondary | ICD-10-CM | POA: Insufficient documentation

## 2018-01-14 DIAGNOSIS — Z803 Family history of malignant neoplasm of breast: Secondary | ICD-10-CM | POA: Diagnosis not present

## 2018-01-14 DIAGNOSIS — D390 Neoplasm of uncertain behavior of uterus: Secondary | ICD-10-CM | POA: Insufficient documentation

## 2018-01-14 DIAGNOSIS — R9389 Abnormal findings on diagnostic imaging of other specified body structures: Secondary | ICD-10-CM | POA: Insufficient documentation

## 2018-01-14 DIAGNOSIS — R1900 Intra-abdominal and pelvic swelling, mass and lump, unspecified site: Secondary | ICD-10-CM | POA: Insufficient documentation

## 2018-01-14 DIAGNOSIS — Z800 Family history of malignant neoplasm of digestive organs: Secondary | ICD-10-CM | POA: Diagnosis not present

## 2018-01-14 NOTE — Progress Notes (Signed)
REFERRING PROVIDER: Nunzio Cobbs, MD [address] [place], [place] [address]  PRIMARY PROVIDER:  Orlena Sheldon, PA-C  PRIMARY REASON FOR VISIT:  1. Family history of breast cancer   2. Family history of colon cancer   3. Family history of brain cancer      HISTORY OF PRESENT ILLNESS:   Kimberly Wall, a 57 y.o. female, was seen for a [facility] cancer genetics consultation at the request of Dr. Yisroel Ramming due to a family history of cancer.  Kimberly Wall presents to clinic today to discuss the possibility of a hereditary predisposition to cancer, genetic testing, and to further clarify her future cancer risks, as well as potential cancer risks for family members.   Kimberly Wall is a 57 y.o. female with no personal history of cancer.  She has a large fibroid, which may result in her getting a hysterectomy.  She will get an MRI of this fibroid in a few weeks.    CANCER HISTORY:   No history exists.     HORMONAL RISK FACTORS:  Menarche was at age 51-13.  First live birth at age 85.  OCP use for approximately 5-6 years.  Ovaries intact: yes.  Hysterectomy: no.  Menopausal status: postmenopausal.  HRT use: 0 years. Colonoscopy: yes; normal. Mammogram within the last year: no. Number of breast biopsies: 0. Up to date with pelvic exams:  yes. Any excessive radiation exposure in the past:  no  Past Medical History:  Diagnosis Date  . Allergy    seasonal  . Family history of brain cancer   . Family history of breast cancer   . Family history of colon cancer   . Hypertension   . Ovarian cyst     Past Surgical History:  Procedure Laterality Date  . CYSTECTOMY  1987    Social History   Socioeconomic History  . Marital status: Married    Spouse name: Not on file  . Number of children: Not on file  . Years of education: Not on file  . Highest education level: Not on file  Occupational History  . Not on file  Social Needs  . Financial resource  strain: Not on file  . Food insecurity:    Worry: Not on file    Inability: Not on file  . Transportation needs:    Medical: Not on file    Non-medical: Not on file  Tobacco Use  . Smoking status: Never Smoker  . Smokeless tobacco: Never Used  Substance and Sexual Activity  . Alcohol use: Yes    Comment: occassional  . Drug use: No  . Sexual activity: Yes    Birth control/protection: Other-see comments    Comment: Husband had Vasectomy  Lifestyle  . Physical activity:    Days per week: Not on file    Minutes per session: Not on file  . Stress: Not on file  Relationships  . Social connections:    Talks on phone: Not on file    Gets together: Not on file    Attends religious service: Not on file    Active member of club or organization: Not on file    Attends meetings of clubs or organizations: Not on file    Relationship status: Not on file  Other Topics Concern  . Not on file  Social History Narrative  . Not on file     FAMILY HISTORY:  We obtained a detailed, 4-generation family history.  Significant diagnoses are listed below: Family History  Problem Relation Age of Onset  . Arthritis Mother   . Cancer Mother        glioblastoma  . Varicose Veins Mother   . Arthritis Father   . Diabetes Father   . Hearing loss Father   . Heart disease Father   . Hypertension Father   . Vision loss Father   . Cancer Maternal Grandmother        throat  . Varicose Veins Maternal Grandmother   . Stroke Maternal Grandfather   . Stroke Paternal Grandfather   . Thyroid disease Sister   . Cancer Maternal Aunt        breast  . Kidney disease Paternal Grandmother   . Breast cancer Cousin        mat cousin, daughter of aunt with breast cancer, dx in her 80s  . Breast cancer Cousin        mat first cousin, daughter of aunt without cancer, dx in her 9s  . Colon cancer Cousin 69       mat cousin; son of mat uncle    The patient has three children who are cancer free.  She has  two sisters who are who are cancer free.  Both parents are deceased.  The patient's mother was diagnosed with glioblastoma at 77 and died at 94.  She had five sisters and a brother.  One sister was diagnosed with breast cancer at 97, and she also had a daughter with breast cancer in her 67's.  Her brother had a son with colon cancer at 75, and one sister who did not have cancer had a daughter with breast cancer.  The patient's grandparents are deceased.  The grandfather died of stroke, and the grandmother died of throat cancer.    The patient's father died of heart disease at 61.  He had a brother and sister who died young, but did not have cancer.  Both paternal grandparents are deceased.  Ms. Montrose is unaware of previous family history of genetic testing for hereditary cancer risks. Patient's maternal ancestors are of [place] and [place] descent, and paternal ancestors are of [place] descent. There is no reported Ashkenazi Jewish ancestry. There is no known consanguinity.  GENETIC COUNSELING ASSESSMENT: [name] is a 57 y.o. female with a family history of cancer which is somewhat suggestive of a hereditary cancer syndrome and predisposition to cancer. We, therefore, discussed and recommended the following at today's visit.   DISCUSSION: We discussed that about 5-10% of breast cancer is hereditary with most cases due to BRCA mutations.  There are other genes, more moderate risk genes, that are associated with hereditary cancer syndromes, such as ATM, CHEK2 and PALB2.  We reviewed the characteristics, features and inheritance patterns of hereditary cancer syndromes. We also discussed genetic testing, including the appropriate family members to test, the process of testing, insurance coverage and turn-around-time for results. We discussed the implications of a negative, positive and/or variant of uncertain significant result. In order to get genetic test results in a timely manner, we recommended Kimberly Wall  pursue genetic testing for the 9-gene STAT test. If this test is negative, we then recommend Kimberly Wall pursue reflex genetic testing to the Multi-cancer gene panel. The Multi-Gene Panel offered by [facility] includes sequencing and/or deletion duplication testing of the following 83 genes: ALK, APC, ATM, AXIN2,BAP1,  BARD1, BLM, BMPR1A, BRCA1, BRCA2, BRIP1, CASR, CDC73, CDH1, CDK4, CDKN1B, CDKN1C, CDKN2A (p14ARF), CDKN2A (p16INK4a), CEBPA,  CHEK2, CTNNA1, DICER1, DIS3L2, EGFR (c.2369C>T, p.Thr790Met variant only), EPCAM (Deletion/duplication testing only), FH, FLCN, GATA2, GPC3, GREM1 (Promoter region deletion/duplication testing only), HOXB13 (c.251G>A, p.Gly84Glu), HRAS, KIT, MAX, MEN1, MET, MITF (c.952G>A, p.Glu318Lys variant only), MLH1, MSH2, MSH3, MSH6, MUTYH, NBN, NF1, NF2, NTHL1, PALB2, PDGFRA, PHOX2B, PMS2, POLD1, POLE, POT1, PRKAR1A, PTCH1, PTEN, RAD50, RAD51C, RAD51D, RB1, RECQL4, RET, RUNX1, SDHAF2, SDHA (sequence changes only), SDHB, SDHC, SDHD, SMAD4, SMARCA4, SMARCB1, SMARCE1, STK11, SUFU, TERT, TERT, TMEM127, TP53, TSC1, TSC2, VHL, WRN and WT1.   Based on Ms. Shaffer's family history of cancer, she meets medical criteria for genetic testing. Despite that she meets criteria, she may still have an out of pocket cost. We discussed that if her out of pocket cost for testing is over $100, the laboratory will call and confirm whether she wants to proceed with testing.  If the out of pocket cost of testing is less than $100 she will be billed by the genetic testing laboratory.   PLAN: After considering the risks, benefits, and limitations, Ms. Taranto  provided informed consent to pursue genetic testing and the blood sample was sent to [facility] for analysis of the STAT panel, reflexing to the Multi-cancer gene panel. Results should be available within approximately 2-3 weeks' time, at which point they will be disclosed by telephone to Ms. Bob, as will any additional recommendations warranted by these  results. Ms. Lutz will receive a summary of her genetic counseling visit and a copy of her results once available. This information will also be available in Epic. We encouraged Ms. Palka to remain in contact with cancer genetics annually so that we can continuously update the family history and inform her of any changes in cancer genetics and testing that may be of benefit for her family. Ms. Rasmus questions were answered to her satisfaction today. Our contact information was provided should additional questions or concerns arise.  Lastly, we encouraged Ms. Marner to remain in contact with cancer genetics annually so that we can continuously update the family history and inform her of any changes in cancer genetics and testing that may be of benefit for this family.   Ms.  Group questions were answered to her satisfaction today. Our contact information was provided should additional questions or concerns arise. Thank you for the referral and allowing [place] to share in the care of your patient.   Karen P. Florene Glen, [place], [facility] Certified Genetic Counselor [email or web address] phone: [phone]  The patient was seen for a total of 50 minutes in face-to-face genetic counseling.  This patient was discussed with Drs. Magrinat, Lindi Adie and/or Burr Medico who agrees with the above.    _______________________________________________________________________ For Office Staff:  Number of people involved in session: 1 Was an Intern/ student involved with case: no

## 2018-01-15 ENCOUNTER — Telehealth: Payer: Self-pay | Admitting: Obstetrics and Gynecology

## 2018-01-15 NOTE — Telephone Encounter (Signed)
Spoke with patient regarding benefits for surgery. Patient advises she is scheduled for an MRI on 01/22/18, she will decided how to proceed after she receives the MRI results. Patient is aware that as of today, there is a robotic date available on 03/26/18. If she needs to proceed with surgery, she may be interested in that date. Patient would like to speak with a nurse about the procedure and recovery, as she is caring for her mother-in-law, who suffers from dementia and needs to know time frames for planning purposes. Advised patient I will forward her question to our Nurse Supervisor.  Routing to Lamont Snowball, RN

## 2018-01-21 ENCOUNTER — Encounter: Payer: Self-pay | Admitting: Genetic Counselor

## 2018-01-21 DIAGNOSIS — Z1379 Encounter for other screening for genetic and chromosomal anomalies: Secondary | ICD-10-CM | POA: Insufficient documentation

## 2018-01-21 NOTE — Telephone Encounter (Signed)
Call to patient to follow-up on questions regarding recovery and potential for June surgery date. Patient states she is really just waiting for MRI results. She needs to know if she has to have surgery or if this can be delayed. 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 MRI is scheduled for tomorrow am and patient will await call with results and next step.  Routing to provider for final review. Patient agreeable to disposition. Will close encounter.

## 2018-01-22 ENCOUNTER — Telehealth: Payer: Self-pay | Admitting: Obstetrics and Gynecology

## 2018-01-22 ENCOUNTER — Ambulatory Visit
Admission: RE | Admit: 2018-01-22 | Discharge: 2018-01-22 | Disposition: A | Discharge status: Home / Self Care | Payer: No Typology Code available for payment source | Source: Ambulatory Visit | Attending: Obstetrics and Gynecology | Admitting: Obstetrics and Gynecology

## 2018-01-22 DIAGNOSIS — D252 Subserosal leiomyoma of uterus: Secondary | ICD-10-CM

## 2018-01-22 MED ORDER — GADOBENATE DIMEGLUMINE 529 MG/ML IV SOLN
20.0000 mL | Freq: Once | INTRAVENOUS | Status: AC | PRN
  Administered 2018-01-22: 20 mL via INTRAVENOUS

## 2018-01-22 NOTE — Telephone Encounter (Signed)
Phone call to patient regarding pelvic MRI.  Mass along right side of uterine fundus 13.9 x 5.8 x 6.2 cm.  Septate areas.  Broad attachment to right uterine fundus. Right ovary extending along right side of pelvic mass. Left ovary normal.  No free fluid.  No adenopathy.  No bone lesions noted.   Atypical myoma versus sarcoma versus right ovarian mass.  I am recommending evaluation and treatment with GYN Oncology.  Appointment made with Dr. Everitt Amber for 1:30 tomorrow with check in at 1:15.   Patient agreeable to appointment date and time.

## 2018-01-23 ENCOUNTER — Ambulatory Visit: Payer: Self-pay | Admitting: Genetic Counselor

## 2018-01-23 ENCOUNTER — Encounter: Payer: Self-pay | Admitting: Obstetrics

## 2018-01-23 ENCOUNTER — Telehealth: Payer: Self-pay | Admitting: Genetic Counselor

## 2018-01-23 ENCOUNTER — Other Ambulatory Visit: Payer: No Typology Code available for payment source

## 2018-01-23 ENCOUNTER — Inpatient Hospital Stay (HOSPITAL_BASED_OUTPATIENT_CLINIC_OR_DEPARTMENT_OTHER): Payer: No Typology Code available for payment source | Admitting: Obstetrics

## 2018-01-23 ENCOUNTER — Ambulatory Visit: Payer: No Typology Code available for payment source | Admitting: Obstetrics

## 2018-01-23 VITALS — BP 146/81 | HR 80 | Temp 98.3°F | Resp 20 | Ht 63.0 in | Wt 229.4 lb

## 2018-01-23 DIAGNOSIS — Z800 Family history of malignant neoplasm of digestive organs: Secondary | ICD-10-CM

## 2018-01-23 DIAGNOSIS — D390 Neoplasm of uncertain behavior of uterus: Secondary | ICD-10-CM

## 2018-01-23 DIAGNOSIS — R1900 Intra-abdominal and pelvic swelling, mass and lump, unspecified site: Secondary | ICD-10-CM | POA: Diagnosis not present

## 2018-01-23 DIAGNOSIS — N858 Other specified noninflammatory disorders of uterus: Secondary | ICD-10-CM

## 2018-01-23 DIAGNOSIS — Z808 Family history of malignant neoplasm of other organs or systems: Secondary | ICD-10-CM

## 2018-01-23 DIAGNOSIS — Z1379 Encounter for other screening for genetic and chromosomal anomalies: Secondary | ICD-10-CM

## 2018-01-23 DIAGNOSIS — Z803 Family history of malignant neoplasm of breast: Secondary | ICD-10-CM

## 2018-01-23 NOTE — Telephone Encounter (Signed)
LM on VM with good news.  Asked that she please CB. 

## 2018-01-23 NOTE — Patient Instructions (Signed)
Preparing for your Surgery  Plan for surgery on January 31, 2018 with Dr. Everitt Amber at [facility] will be scheduled for a robotic assisted total laparoscopic hysterectomy, bilateral salpingo-oophorectomy, possible exploratory laparotomy (larger incision on your abdomen), possible staging.   Pre-operative Testing -You will receive a phone call from presurgical testing at [facility] to arrange for a pre-operative testing appointment before your surgery.  This appointment normally occurs one to two weeks before your scheduled surgery.   -Bring your insurance card, copy of an advanced directive if applicable, medication list  -At that visit, you will be asked to sign a consent for a possible blood transfusion in case a transfusion becomes necessary during surgery.  The need for a blood transfusion is rare but having consent is a necessary part of your care.     -You should not be taking blood thinners or aspirin at least ten days prior to surgery unless instructed by your surgeon.  Day Before Surgery at [facility] will be asked to take in a light diet the day before surgery.  Avoid carbonated beverages.  You will be advised to have nothing to eat or drink after midnight the evening before.    Eat a light diet the day before surgery.  Examples including soups, broths, toast, yogurt, mashed potatoes.  Things to avoid include carbonated beverages (fizzy beverages), raw fruits and raw vegetables, or beans.   If your bowels are filled with gas, your surgeon will have difficulty visualizing your pelvic organs which increases your surgical risks.  Your role in recovery Your role is to become active as soon as directed by your doctor, while still giving yourself time to heal.  Rest when you feel tired. You will be asked to do the following in order to speed your recovery:  - Cough and breathe deeply. This helps toclear and expand your lungs and can prevent pneumonia.  You may be given a spirometer to practice deep breathing. A staff member will show you how to use the spirometer. - Do mild physical activity. Walking or moving your legs help your circulation and body functions return to normal. A staff member will help you when you try to walk and will provide you with simple exercises. Do not try to get up or walk alone the first time. - Actively manage your pain. Managing your pain lets you move in comfort. We will ask you to rate your pain on a scale of zero to 10. It is your responsibility to tell your doctor or nurse where and how much you hurt so your pain can be treated.  Special Considerations -If you are diabetic, you may be placed on insulin after surgery to have closer control over your blood sugars to promote healing and recovery.  This does not mean that you will be discharged on insulin.  If applicable, your oral antidiabetics will be resumed when you are tolerating a solid diet.  -Your final pathology results from surgery should be available by the Friday after surgery and the results will be relayed to you when available.  -Dr. Lahoma Crocker is the Surgeon that assists your GYN Oncologist with surgery.  The next day after your surgery you will either see your GYN Oncologist or Dr. Lahoma Crocker.   Blood Transfusion Information WHAT IS A BLOOD TRANSFUSION? A transfusion is the replacement of blood or some of its parts. Blood is made up of multiple cells which provide different functions.  Red blood  cells carry oxygen and are used for blood loss replacement.  White blood cells fight against infection.  Platelets control bleeding.  Plasma helps clot blood.  Other blood products are available for specialized needs, such as hemophilia or other clotting disorders. BEFORE THE TRANSFUSION  Who gives blood for transfusions?   You may be able to donate blood to be used at a later date on yourself (autologous donation).  Relatives can  be asked to donate blood. This is generally not any safer than if you have received blood from a stranger. The same precautions are taken to ensure safety when a relative's blood is donated.  Healthy volunteers who are fully evaluated to make sure their blood is safe. This is blood bank blood. Transfusion therapy is the safest it has ever been in the practice of medicine. Before blood is taken from a donor, a complete history is taken to make sure that person has no history of diseases nor engages in risky social behavior (examples are intravenous drug use or sexual activity with multiple partners). The donor's travel history is screened to minimize risk of transmitting infections, such as malaria. The donated blood is tested for signs of infectious diseases, such as HIV and hepatitis. The blood is then tested to be sure it is compatible with you in order to minimize the chance of a transfusion reaction. If you or a relative donates blood, this is often done in anticipation of surgery and is not appropriate for emergency situations. It takes many days to process the donated blood. RISKS AND COMPLICATIONS Although transfusion therapy is very safe and saves many lives, the main dangers of transfusion include:   Getting an infectious disease.  Developing a transfusion reaction. This is an allergic reaction to something in the blood you were given. Every precaution is taken to prevent this. The decision to have a blood transfusion has been considered carefully by your caregiver before blood is given. Blood is not given unless the benefits outweigh the risks.

## 2018-01-23 NOTE — Progress Notes (Addendum)
Consult Note: Gyn-Onc  Consult was requested by Dr. Quincy Simmonds for the evaluation of Kimberly Wall 57 y.o. female  CC:  Chief Complaint  Patient presents with  . Uterine mass    HPI: Ms. Kimberly Wall  is a very nice 57 y.o.  P3  She has had a long history of pelvic relaxation and reports a long history of fibroids.  As long as 12 years ago she was noted in [place] by her gynecologist to be diagnosed with both of these conditions.  She has been followed here in [facility] by Dr. Juanda Chance with [place] for approximately 12 years.  She has been getting intermittent ultrasound and pelvic exams to follow her "fibroids".  Her insurance changed approximately 1 year ago and her follow-up has been with Dr. Quincy Simmonds since that time.  The patient notes that her last menstrual period was March 2018 with the year prior to that having oligomenorrhea but on occasion bleeding as long as 3 weeks.  She did not go quite a whole year before bleeding again with the most recent bleed being February 2019 for 5 days which seemed to be normal period.  Dr. Quincy Simmonds checked an [facility] and that was 30.4 with an estradiol being 11.1 on 11/26/2017.  Given the question of postmenopausal bleeding and endometrial biopsy was performed and found to be benign December 13, 2017.  Pap smear November 26, 2017 showed no intraepithelial lesion or malignancy and negative high risk HPV.  Patient has a family history of breast cancer and had genetic testing recently completed with an 99 gene panel with the findings of no significant mutations.  Dr. Quincy Simmonds was considering a hysterectomy with repair for her significant uterovaginal prolapse including the possibility of sacral colpopexy.  Given the history of the fibroid and the possibility of morcellation an MRI was obtained for reassurance.  Unfortunately the MRI dated 01/22/2018 describes the mass as being heterogeneous with internal enhancement not typical of a benign leiomyoma and alternatively the lesion  may be arising from the right ovary.  Given this possibility the patient was referred for management.  Measurement of disease:  No results for input(s): CA125, CAN125, CEA, CA199, ESTRADIOL, INHBB in the last 8760 hours.  Invalid input(s): INHIBINA  . Pending further workup   Radiology: . 01/22/2018-MRI of the pelvis with and without contrast-[place] [facility]-uterus is 7.1 x 3.9 x 4.7 with a normal trilaminar appearance of the endometrium.  A large well-circumscribed mass is identified along the right side of the uterine fundus measuring 13.9 x 5.8 x 6.2.  The mass is predominantly T2 hypointense and T1 hypointense with mild heterogeneous areas of internal enhancement following IV contrast.  The margins of this mass show a septated area of increased fluid signal intensity.  There appears to be a large broad-based attachment to the right side of the uterine fundus which measures 3.8 cm.  The right ovary appears to extend along the right side of the pelvic mass.  There is a normal left ovary.  Impression differential includes atypical appearance of a benign leiomyoma versus malignant lesions.  Overall the appearance is not typical of a benign leiomyoma.  Alternatively the lesion may be arising from the right ovary.    Oncologic History: Pending further workup    No history exists.    Current Meds:  Outpatient Encounter Medications as of 01/23/2018  Medication Sig  . enalapril (VASOTEC) 10 MG tablet Take 1 tablet (10 mg total) by mouth daily.  . hydrochlorothiazide (HYDRODIURIL) 25  MG tablet Take 1 tablet (25 mg total) by mouth daily.   No facility-administered encounter medications on file as of 01/23/2018.     Allergy: No Known Allergies  Social Hx:   Social History   Socioeconomic History  . Marital status: Married    Spouse name: Not on file  . Number of children: Not on file  . Years of education: Not on file  . Highest education level: Not on file  Occupational History  .  Not on file  Social Needs  . Financial resource strain: Not on file  . Food insecurity:    Worry: Not on file    Inability: Not on file  . Transportation needs:    Medical: Not on file    Non-medical: Not on file  Tobacco Use  . Smoking status: Never Smoker  . Smokeless tobacco: Never Used  Substance and Sexual Activity  . Alcohol use: Yes    Comment: 1/month  . Drug use: No  . Sexual activity: Yes    Birth control/protection: Other-see comments    Comment: Husband had Vasectomy  Lifestyle  . Physical activity:    Days per week: Not on file    Minutes per session: Not on file  . Stress: Not on file  Relationships  . Social connections:    Talks on phone: Not on file    Gets together: Not on file    Attends religious service: Not on file    Active member of club or organization: Not on file    Attends meetings of clubs or organizations: Not on file    Relationship status: Not on file  . Intimate partner violence:    Fear of current or ex partner: Not on file    Emotionally abused: Not on file    Physically abused: Not on file    Forced sexual activity: Not on file  Other Topics Concern  . Not on file  Social History Narrative  . Not on file    Past Surgical Hx:  Past Surgical History:  Procedure Laterality Date  . PILONIDAL CYST EXCISION  1987    Past Medical Hx:  Past Medical History:  Diagnosis Date  . Allergy    seasonal  . Family history of cancer   . Hypertension   . Ovarian cyst    Sounds like this is a misinterpretation and she is referring to her fibroid    Past Gynecological History:   GYNECOLOGIC HISTORY:  No LMP recorded. Patient is perimenopausal. P 3 LMP 11/13/2017 Contraceptive greater than 10 years of OCPs; currently female partner has a vasectomy HRT none  Last Pap February 2019 normal and on the chart  Family Hx:  Family History  Problem Relation Age of Onset  . Arthritis Mother   . Cancer Mother        glioblastoma  . Varicose  Veins Mother   . Arthritis Father   . Diabetes Father   . Hearing loss Father   . Heart disease Father   . Hypertension Father   . Vision loss Father   . Cancer Maternal Grandmother        throat  . Varicose Veins Maternal Grandmother   . Stroke Maternal Grandfather   . Stroke Paternal Grandfather   . Thyroid disease Sister   . Cancer Maternal Aunt        breast  . Kidney disease Paternal Grandmother   . Breast cancer Cousin        mat  cousin, daughter of aunt with breast cancer, dx in her 1s  . Breast cancer Cousin        mat first cousin, daughter of aunt without cancer, dx in her 63s  . Colon cancer Cousin 39       mat cousin; son of mat uncle    Review of Systems:  Review of Systems  Constitutional: Negative.   HENT: Negative.   Eyes: Negative.   Respiratory: Negative.   Cardiovascular: Negative.   Gastrointestinal: Negative.   Genitourinary: Positive for urgency.  Musculoskeletal: Negative.   Skin: Negative.   Neurological: Negative.   Endo/Heme/Allergies: Negative.   Psychiatric/Behavioral: Negative.    Gyn - ? Postmenopausal bleeding  Vitals:  Blood pressure (!) 146/81, pulse 80, temperature 98.3 F (36.8 C), temperature source Oral, resp. rate 20, height 5\' 3"  (1.6 m), weight 229 lb 6.4 oz (104.1 kg), SpO2 98 %. Body mass index is 40.64 kg/m.   Physical Exam: ECOG PERFORMANCE STATUS: 1 - Symptomatic but completely ambulatory   General :  Well developed, 57 y.o., female in no apparent distress HEENT:  Normocephalic/atraumatic, symmetric, EOMI, eyelids normal Neck:   Supple, no masses.  Lymphatics:  No cervical/ submandibular/ supraclavicular/ infraclavicular/ inguinal adenopathy Respiratory:  Respirations unlabored, no use of accessory muscles CV:   Deferred Breast:  Deferred Musculoskeletal: No CVA tenderness, normal muscle strength. Abdomen:  Overweight soft, non-tender and nondistended. No evidence of hernia. No masses. Extremities:  No  lymphedema, no erythema, non-tender. Skin:   Normal inspection Neuro/Psych:  No focal motor deficit, no abnormal mental status. Normal gait. Normal affect. Alert and oriented to person, place, and time  Genito Urinary: Vulva: Normal external female genitalia.  Bladder/urethra: Urethral meatus normal in size and location. No lesions or   masses, evidence of a cystocele and uterovaginal prolapse. Speculum exam: Vagina: No lesion, no discharge, no bleeding. Cervix: Normal appearing, no lesions. Bimanual exam:  Uterus: Difficult to delineate size , mobile.  Adnexa: No masses. Rectovaginal:  Good tone, no masses, no cul de sac nodularity, no parametrial involvement or nodularity.  Oncologic Summary: 1. Pelvic mass suspected fibroid possible adnexal mass  Assessment/Plan: 1. Pelvic mass o My suspicion is given her long history that this is likely a fibroid o The possibility of adnexal mass is raised on the imaging therefore Ca1 25 is ordered today 2. Postmenopausal bleeding o She technically has not gone a year without her menses however her [place] is greater than 9 and she is 56 years old o Given the MRI and this possible postmenopausal bleeding I agree with Dr. Quincy Simmonds and hesitating to morcellate the "fibroid" 3. Surgical discussion o If this is an adnexal mass plan would be for laparoscopic removal and frozen section then appropriate hysterectomy BSO staging as indicated o If this is a fibroid in the procedure cannot be completed laparoscopically then conversion to an open hysterectomy frozen section possible BSO staging as indicated o We discussed BSO someone in her age group I would recommend at least consideration for bilateral salpingectomy.  She will consider further whether she wants to have both ovaries removed for now the plan is BSO until we discussed further in the preop setting (defer to Dr. Denman George discussing further with the patient - see below) o We reviewed the risks benefits  alternatives of the surgery and she was presented with a surgical sketch today.  Please refer to this scanned document. 4. Multiple dates were offered with me in the operating room none of which worked  with the patient's schedule due to family obligations.  However my partner has an available date that might work and I will present this case to Dr. Denman George in hopes to have her surgery scheduled in the next couple of weeks. 5. All of her and her husband appropriate questions were answered to the best my ability and they seem satisfied the plan of care at the end of the visit.   Isabel Caprice, MD  01/23/2018, 4:34 PM  Cc: Josefa Half, MD Dr. [facility] (PCP)

## 2018-01-23 NOTE — Progress Notes (Signed)
HPI:  Ms. Akey was previously seen in the [place] clinic due to a family history of cancer and concerns regarding a hereditary predisposition to cancer. Please refer to our prior cancer genetics clinic note for more information regarding Ms. Postle's medical, social and family histories, and our assessment and recommendations, at the time. Ms. Basto recent genetic test results were disclosed to her, as were recommendations warranted by these results. These results and recommendations are discussed in more detail below.  CANCER HISTORY:   No history exists.    FAMILY HISTORY:  We obtained a detailed, 4-generation family history.  Significant diagnoses are listed below: Family History  Problem Relation Age of Onset  . Arthritis Mother   . Cancer Mother        glioblastoma  . Varicose Veins Mother   . Arthritis Father   . Diabetes Father   . Hearing loss Father   . Heart disease Father   . Hypertension Father   . Vision loss Father   . Cancer Maternal Grandmother        throat  . Varicose Veins Maternal Grandmother   . Stroke Maternal Grandfather   . Stroke Paternal Grandfather   . Thyroid disease Sister   . Cancer Maternal Aunt        breast  . Kidney disease Paternal Grandmother   . Breast cancer Cousin        mat cousin, daughter of aunt with breast cancer, dx in her 78s  . Breast cancer Cousin        mat first cousin, daughter of aunt without cancer, dx in her 23s  . Colon cancer Cousin 74       mat cousin; son of mat uncle    The patient has three children who are cancer free.  She has two sisters who are who are cancer free.  Both parents are deceased.  The patient's mother was diagnosed with glioblastoma at 61 and died at 77.  She had five sisters and a brother.  One sister was diagnosed with breast cancer at 74, and she also had a daughter with breast cancer in her 69's.  Her brother had a son with colon cancer at 58, and one sister who did not have  cancer had a daughter with breast cancer.  The patient's grandparents are deceased.  The grandfather died of stroke, and the grandmother died of throat cancer.    The patient's father died of heart disease at 16.  He had a brother and sister who died young, but did not have cancer.  Both paternal grandparents are deceased.  Ms. Gaumond is unaware of previous family history of genetic testing for hereditary cancer risks. Patient's maternal ancestors are of [place] and [place] descent, and paternal ancestors are of [place] descent. There is no reported Ashkenazi Jewish ancestry. There is no known consanguinity.  GENETIC TEST RESULTS: Genetic testing reported out on January 21, 2018  through the multi-cancer panel found no deleterious mutations.  The Multi-Gene Panel offered by [facility] includes sequencing and/or deletion duplication testing of the following 83 genes: ALK, APC, ATM, AXIN2,BAP1,  BARD1, BLM, BMPR1A, BRCA1, BRCA2, BRIP1, CASR, CDC73, CDH1, CDK4, CDKN1B, CDKN1C, CDKN2A (p14ARF), CDKN2A (p16INK4a), CEBPA, CHEK2, CTNNA1, DICER1, DIS3L2, EGFR (c.2369C>T, p.Thr790Met variant only), EPCAM (Deletion/duplication testing only), FH, FLCN, GATA2, GPC3, GREM1 (Promoter region deletion/duplication testing only), HOXB13 (c.251G>A, p.Gly84Glu), HRAS, KIT, MAX, MEN1, MET, MITF (c.952G>A, p.Glu318Lys variant only), MLH1, MSH2, MSH3, MSH6, MUTYH, NBN, NF1, NF2, NTHL1, PALB2,  PDGFRA, PHOX2B, PMS2, POLD1, POLE, POT1, PRKAR1A, PTCH1, PTEN, RAD50, RAD51C, RAD51D, RB1, RECQL4, RET, RUNX1, SDHAF2, SDHA (sequence changes only), SDHB, SDHC, SDHD, SMAD4, SMARCA4, SMARCB1, SMARCE1, STK11, SUFU, TERT, TERT, TMEM127, TP53, TSC1, TSC2, VHL, WRN and WT1.  The test report has been scanned into EPIC and is located under the Molecular Pathology section of the Results Review tab.    We discussed with Ms. Inman that since the current genetic testing is not perfect, it is possible there may be a gene mutation in one of these genes that  current testing cannot detect, but that chance is small.  We also discussed, that it is possible that another gene that has not yet been discovered, or that we have not yet tested, is responsible for the cancer diagnoses in the family, and it is, therefore, important to remain in touch with cancer genetics in the future so that we can continue to offer Ms. Yehle the most up to date genetic testing.    CANCER SCREENING RECOMMENDATIONS:  This normal result is reassuring and indicates that Ms. Labo does not likely have an increased risk of cancer due to a mutation in one of these genes.  We, therefore, recommended  Ms. Kopka continue to follow the cancer screening guidelines provided by her primary healthcare providers.   An individual's cancer risk and medical management are not determined by genetic test results alone. Overall cancer risk assessment incorporates additional factors, including personal medical history, family history, and any available genetic information that may result in a personalized plan for cancer prevention and surveillance.  RECOMMENDATIONS FOR FAMILY MEMBERS:  Women in this family might be at some increased risk of developing cancer, over the general population risk, simply due to the family history of cancer.  We recommended women in this family have a yearly mammogram beginning at age 24, or 52 years younger than the earliest onset of cancer, an annual clinical breast exam, and perform monthly breast self-exams. Women in this family should also have a gynecological exam as recommended by their primary provider. All family members should have a colonoscopy by age 31.  Based on Ms. Delucchi's family history, we recommended her cousins, who were diagnosed with breast cancer in their 35's, have genetic counseling and testing. Ms. King will let us know if we can be of any assistance in coordinating genetic counseling and/or testing for this family member.   FOLLOW-UP: Lastly, we discussed  with Ms. Mondo that cancer genetics is a rapidly advancing field and it is possible that new genetic tests will be appropriate for her and/or her family members in the future. We encouraged her to remain in contact with cancer genetics on an annual basis so we can update her personal and family histories and let her know of advances in cancer genetics that may benefit this family.   Our contact number was provided. Ms. Robles's questions were answered to her satisfaction, and she knows she is welcome to call us at anytime with additional questions or concerns.   Roma Kayser, MS, [facility] Certified Genetic Counselor [email or web address]

## 2018-01-23 NOTE — Telephone Encounter (Signed)
Revealed negative genetic testing.  Discussed that we do not know why there is cancer in the family. It could be due to a different gene that we are not testing, or maybe our current technology may not be able to pick something up.  It will be important for her to keep in contact with genetics to keep up with whether additional testing may be needed.  Patient indicated that she has an appointment in GYN/ONC today about her fibroid.  I released a copy of her result to her so that she can take a copy with her.

## 2018-01-24 ENCOUNTER — Other Ambulatory Visit: Payer: Self-pay | Admitting: Obstetrics and Gynecology

## 2018-01-24 DIAGNOSIS — Z1231 Encounter for screening mammogram for malignant neoplasm of breast: Secondary | ICD-10-CM

## 2018-01-24 NOTE — Patient Instructions (Signed)
Tasheema Perrone  01/24/2018   Your procedure is scheduled on: 01-31-18  Report to [facility] Main  Entrance              Report to admitting at      [place] AM    Call this number if you have problems the morning of surgery [phone]                FOLLOW A LIGHT DIET THE [place]. EX: SOUPS ,BROTHS, YOGURT, MASHED POTATOES . AVOID RAW FRUITS AND VEGGIES  BEANS AND CARBONATED BEVERAGES     NO SOLID FOOD AFTER MIDNIGHT THE NIGHT PRIOR TO SURGERY. NOTHING BY MOUTH EXCEPT CLEAR LIQUIDS UNTIL 3 HOURS PRIOR TO [place] SURGERY. PLEASE FINISH ENSURE DRINK PER SURGEON ORDER 3 HOURS PRIOR TO SCHEDULED SURGERY TIME WHICH NEEDS TO BE COMPLETED AT _______0430am _____.NOTHING BY MOUTH AFTER 0430AM   Take these medicines the morning of surgery with A SIP OF WATER: none                                You may not have any metal on your body including hair pins and              piercings  Do not wear jewelry, make-up, lotions, powders or perfumes, deodorant             Do not wear nail polish.  Do not shave  48 hours prior to surgery.           Do not bring valuables to the hospital. [place].  Contacts, dentures or bridgework may not be worn into surgery.  Leave suitcase in the car. After surgery it may be brought to your room.                  Please read over the following fact sheets you were given: _____________________________________________________________________           [facility] - Preparing for Surgery Before surgery, you can play an important role.  Because skin is not sterile, your skin needs to be as free of germs as possible.  You can reduce the number of germs on your skin by washing with CHG (chlorahexidine gluconate) soap before surgery.  CHG is an antiseptic cleaner which kills germs and bonds with the skin to continue killing germs even after washing. Please DO NOT use if you have an  allergy to CHG or antibacterial soaps.  If your skin becomes reddened/irritated stop using the CHG and inform your nurse when you arrive at Short Stay. Do not shave (including legs and underarms) for at least 48 hours prior to the first CHG shower.  You may shave your face/neck. Please follow these instructions carefully:  1.  Shower with CHG Soap the night before surgery and the  morning of Surgery.  2.  If you choose to wash your hair, wash your hair first as usual with your  normal  shampoo.  3.  After you shampoo, rinse your hair and body thoroughly to remove the  shampoo.                           4.  Use CHG as you would any other liquid soap.  You can apply chg directly  to the skin and wash                       Gently with a scrungie or clean washcloth.  5.  Apply the CHG Soap to your body ONLY FROM THE NECK DOWN.   Do not use on face/ open                           Wound or open sores. Avoid contact with eyes, ears mouth and genitals (private parts).                       Wash face,  Genitals (private parts) with your normal soap.             6.  Wash thoroughly, paying special attention to the area where your surgery  will be performed.  7.  Thoroughly rinse your body with warm water from the neck down.  8.  DO NOT shower/wash with your normal soap after using and rinsing off  the CHG Soap.                9.  Pat yourself dry with a clean towel.            10.  Wear clean pajamas.            11.  Place clean sheets on your bed the night of your first shower and do not  sleep with pets. Day of Surgery : Do not apply any lotions/deodorants the morning of surgery.  Please wear clean clothes to the hospital/surgery center.  FAILURE TO FOLLOW THESE INSTRUCTIONS MAY RESULT IN THE CANCELLATION OF YOUR SURGERY PATIENT SIGNATURE_________________________________  NURSE  SIGNATURE__________________________________  ________________________________________________________________________  WHAT IS A BLOOD TRANSFUSION? Blood Transfusion Information  A transfusion is the replacement of blood or some of its parts. Blood is made up of multiple cells which provide different functions.  Red blood cells carry oxygen and are used for blood loss replacement.  White blood cells fight against infection.  Platelets control bleeding.  Plasma helps clot blood.  Other blood products are available for specialized needs, such as hemophilia or other clotting disorders. BEFORE THE TRANSFUSION  Who gives blood for transfusions?   Healthy volunteers who are fully evaluated to make sure their blood is safe. This is blood bank blood. Transfusion therapy is the safest it has ever been in the practice of medicine. Before blood is taken from a donor, a complete history is taken to make sure that person has no history of diseases nor engages in risky social behavior (examples are intravenous drug use or sexual activity with multiple partners). The donor's travel history is screened to minimize risk of transmitting infections, such as malaria. The donated blood is tested for signs of infectious diseases, such as HIV and hepatitis. The blood is then tested to be sure it is compatible with you in order to minimize the chance of a transfusion reaction. If you or a relative donates blood, this is often done in anticipation of surgery and is not appropriate for emergency situations. It takes many days to process the donated blood. RISKS AND COMPLICATIONS Although transfusion therapy is very safe and saves many lives, the main dangers of transfusion include:   Getting an infectious disease.  Developing a transfusion reaction. This is an  allergic reaction to something in the blood you were given. Every precaution is taken to prevent this. The decision to have a blood transfusion has been  considered carefully by your caregiver before blood is given. Blood is not given unless the benefits outweigh the risks. AFTER THE TRANSFUSION  Right after receiving a blood transfusion, you will usually feel much better and more energetic. This is especially true if your red blood cells have gotten low (anemic). The transfusion raises the level of the red blood cells which carry oxygen, and this usually causes an energy increase.  The nurse administering the transfusion will monitor you carefully for complications. HOME CARE INSTRUCTIONS  No special instructions are needed after a transfusion. You may find your energy is better. Speak with your caregiver about any limitations on activity for underlying diseases you may have. SEEK MEDICAL CARE IF:   Your condition is not improving after your transfusion.  You develop redness or irritation at the intravenous (IV) site. SEEK IMMEDIATE MEDICAL CARE IF:  Any of the following symptoms occur over the next 12 hours:  Shaking chills.  You have a temperature by mouth above 102 F (38.9 C), not controlled by medicine.  Chest, back, or muscle pain.  People around you feel you are not acting correctly or are confused.  Shortness of breath or difficulty breathing.  Dizziness and fainting.  You get a rash or develop hives.  You have a decrease in urine output.  Your urine turns a dark color or changes to pink, red, or brown. Any of the following symptoms occur over the next 10 days:  You have a temperature by mouth above 102 F (38.9 C), not controlled by medicine.  Shortness of breath.  Weakness after normal activity.  The white part of the eye turns yellow (jaundice).  You have a decrease in the amount of urine or are urinating less often.  Your urine turns a dark color or changes to pink, red, or brown. Document Released: 09/22/2000 Document Revised: 12/18/2011 Document Reviewed: 05/11/2008 ExitCare Patient Information 2014  [place].  _______________________________________________________________________  Incentive Spirometer  An incentive spirometer is a tool that can help keep your lungs clear and active. This tool measures how well you are filling your lungs with each breath. Taking long deep breaths may help reverse or decrease the chance of developing breathing (pulmonary) problems (especially infection) following:  A long period of time when you are unable to move or be active. BEFORE THE PROCEDURE   If the spirometer includes an indicator to show your best effort, your nurse or respiratory therapist will set it to a desired goal.  If possible, sit up straight or lean slightly forward. Try not to slouch.  Hold the incentive spirometer in an upright position. INSTRUCTIONS FOR USE  1. Sit on the edge of your bed if possible, or sit up as far as you can in bed or on a chair. 2. Hold the incentive spirometer in an upright position. 3. Breathe out normally. 4. Place the mouthpiece in your mouth and seal your lips tightly around it. 5. Breathe in slowly and as deeply as possible, raising the piston or the ball toward the top of the column. 6. Hold your breath for 3-5 seconds or for as long as possible. Allow the piston or ball to fall to the bottom of the column. 7. Remove the mouthpiece from your mouth and breathe out normally. 8. Rest for a few seconds and repeat Steps 1 through 7  at least 10 times every 1-2 hours when you are awake. Take your time and take a few normal breaths between deep breaths. 9. The spirometer may include an indicator to show your best effort. Use the indicator as a goal to work toward during each repetition. 10. After each set of 10 deep breaths, practice coughing to be sure your lungs are clear. If you have an incision (the cut made at the time of surgery), support your incision when coughing by placing a pillow or rolled up towels firmly against it. Once you are able to get  out of bed, walk around indoors and cough well. You may stop using the incentive spirometer when instructed by your caregiver.  RISKS AND COMPLICATIONS  Take your time so you do not get dizzy or light-headed.  If you are in pain, you may need to take or ask for pain medication before doing incentive spirometry. It is harder to take a deep breath if you are having pain. AFTER USE  Rest and breathe slowly and easily.  It can be helpful to keep track of a log of your progress. Your caregiver can provide you with a simple table to help with this. If you are using the spirometer at home, follow these instructions: [place] IF:   You are having difficultly using the spirometer.  You have trouble using the spirometer as often as instructed.  Your pain medication is not giving enough relief while using the spirometer.  You develop fever of 100.5 F (38.1 C) or higher. SEEK IMMEDIATE MEDICAL CARE IF:   You cough up bloody sputum that had not been present before.  You develop fever of 102 F (38.9 C) or greater.  You develop worsening pain at or near the incision site. MAKE SURE YOU:   Understand these instructions.  Will watch your condition.  Will get help right away if you are not doing well or get worse. Document Released: 02/05/2007 Document Revised: 12/18/2011 Document Reviewed: 04/08/2007 [facility] Patient Information 2014 [place], [place].   ________________________________________________________________________

## 2018-01-28 ENCOUNTER — Telehealth: Payer: Self-pay

## 2018-01-28 NOTE — Telephone Encounter (Signed)
Told Cinda that the information was sent to the prior authorization department today.

## 2018-01-29 ENCOUNTER — Other Ambulatory Visit: Payer: Self-pay

## 2018-01-29 ENCOUNTER — Encounter (HOSPITAL_COMMUNITY): Payer: Self-pay

## 2018-01-29 ENCOUNTER — Encounter: Payer: Self-pay | Admitting: Gynecologic Oncology

## 2018-01-29 ENCOUNTER — Encounter (HOSPITAL_COMMUNITY)
Admission: RE | Admit: 2018-01-29 | Discharge: 2018-01-29 | Disposition: A | Discharge status: Home / Self Care | Payer: No Typology Code available for payment source | Source: Ambulatory Visit | Attending: Gynecologic Oncology | Admitting: Gynecologic Oncology

## 2018-01-29 ENCOUNTER — Inpatient Hospital Stay (HOSPITAL_BASED_OUTPATIENT_CLINIC_OR_DEPARTMENT_OTHER): Payer: No Typology Code available for payment source | Admitting: Gynecologic Oncology

## 2018-01-29 VITALS — BP 113/73 | HR 68 | Temp 97.8°F | Resp 18 | Ht 63.0 in | Wt 228.7 lb

## 2018-01-29 DIAGNOSIS — I10 Essential (primary) hypertension: Secondary | ICD-10-CM | POA: Diagnosis not present

## 2018-01-29 DIAGNOSIS — R1900 Intra-abdominal and pelvic swelling, mass and lump, unspecified site: Secondary | ICD-10-CM | POA: Diagnosis not present

## 2018-01-29 DIAGNOSIS — R9389 Abnormal findings on diagnostic imaging of other specified body structures: Secondary | ICD-10-CM

## 2018-01-29 DIAGNOSIS — Z79899 Other long term (current) drug therapy: Secondary | ICD-10-CM | POA: Diagnosis not present

## 2018-01-29 DIAGNOSIS — B372 Candidiasis of skin and nail: Secondary | ICD-10-CM

## 2018-01-29 DIAGNOSIS — N858 Other specified noninflammatory disorders of uterus: Secondary | ICD-10-CM

## 2018-01-29 DIAGNOSIS — D390 Neoplasm of uncertain behavior of uterus: Secondary | ICD-10-CM | POA: Diagnosis present

## 2018-01-29 DIAGNOSIS — N838 Other noninflammatory disorders of ovary, fallopian tube and broad ligament: Secondary | ICD-10-CM | POA: Diagnosis not present

## 2018-01-29 DIAGNOSIS — D252 Subserosal leiomyoma of uterus: Secondary | ICD-10-CM | POA: Diagnosis not present

## 2018-01-29 HISTORY — DX: Adverse effect of unspecified anesthetic, initial encounter: T41.45XA

## 2018-01-29 HISTORY — DX: Unspecified osteoarthritis, unspecified site: M19.90

## 2018-01-29 HISTORY — DX: Nausea with vomiting, unspecified: R11.2

## 2018-01-29 HISTORY — DX: Other complications of anesthesia, initial encounter: T88.59XA

## 2018-01-29 HISTORY — DX: Other specified postprocedural states: Z98.890

## 2018-01-29 LAB — URINALYSIS, ROUTINE W REFLEX MICROSCOPIC
BILIRUBIN URINE: NEGATIVE
Glucose, UA: NEGATIVE mg/dL
HGB URINE DIPSTICK: NEGATIVE
Ketones, ur: NEGATIVE mg/dL
Leukocytes, UA: NEGATIVE
Nitrite: NEGATIVE
PH: 7 (ref 5.0–8.0)
Protein, ur: NEGATIVE mg/dL
SPECIFIC GRAVITY, URINE: 1.008 (ref 1.005–1.030)

## 2018-01-29 LAB — COMPREHENSIVE METABOLIC PANEL
ALK PHOS: 66 U/L (ref 38–126)
ALT: 27 U/L (ref 14–54)
AST: 26 U/L (ref 15–41)
Albumin: 3.6 g/dL (ref 3.5–5.0)
Anion gap: 10 (ref 5–15)
BILIRUBIN TOTAL: 0.3 mg/dL (ref 0.3–1.2)
BUN: 10 mg/dL (ref 6–20)
CALCIUM: 9.2 mg/dL (ref 8.9–10.3)
CO2: 28 mmol/L (ref 22–32)
CREATININE: 0.81 mg/dL (ref 0.44–1.00)
Chloride: 103 mmol/L (ref 101–111)
Glucose, Bld: 93 mg/dL (ref 65–99)
Potassium: 3.7 mmol/L (ref 3.5–5.1)
Sodium: 141 mmol/L (ref 135–145)
Total Protein: 7 g/dL (ref 6.5–8.1)

## 2018-01-29 LAB — CBC
HEMATOCRIT: 44.3 % (ref 36.0–46.0)
HEMOGLOBIN: 14.8 g/dL (ref 12.0–15.0)
MCH: 31.1 pg (ref 26.0–34.0)
MCHC: 33.4 g/dL (ref 30.0–36.0)
MCV: 93.1 fL (ref 78.0–100.0)
Platelets: 215 10*3/uL (ref 150–400)
RBC: 4.76 MIL/uL (ref 3.87–5.11)
RDW: 13.1 % (ref 11.5–15.5)
WBC: 6.4 10*3/uL (ref 4.0–10.5)

## 2018-01-29 LAB — PREGNANCY, URINE: Preg Test, Ur: NEGATIVE

## 2018-01-29 LAB — ABO/RH: ABO/RH(D): A POS

## 2018-01-29 MED ORDER — NYSTATIN 100000 UNIT/GM EX POWD: Freq: Two times a day (BID) | CUTANEOUS | 0 refills | Status: DC

## 2018-01-29 MED FILL — NYAMYC 100,000 UNITS/GM PWD: 100000 | 15 days supply | Qty: 30 | Fill #0

## 2018-01-29 NOTE — Progress Notes (Signed)
H&P Note: Gyn-Onc  Consult was requested by Dr. Quincy Simmonds for the evaluation of Kimberly Wall 57 y.o. female  CC:  Chief Complaint  Patient presents with  . Uterine mass    HPI: Kimberly Wall  is a very nice 57 y.o.  P3  She has had a long history of pelvic relaxation and reports a long history of fibroids.  As long as 12 years ago she was noted in [place] by her gynecologist to be diagnosed with both of these conditions.  She has been followed here in [facility] by Dr. Juanda Chance with [facility] for approximately 12 years.  She has been getting intermittent ultrasound and pelvic exams to follow her "fibroids".  Her insurance changed approximately 1 year ago and her follow-up has been with Dr. Quincy Simmonds since that time.  The patient notes that her last menstrual period was March 2018 with the year prior to that having oligomenorrhea but on occasion bleeding as long as 3 weeks.  She did not go quite a whole year before bleeding again with the most recent bleed being February 2019 for 5 days which seemed to be normal period.  Dr. Quincy Simmonds checked an [facility] and that was 30.4 with an estradiol being 11.1 on 11/26/2017.  Given the question of postmenopausal bleeding and endometrial biopsy was performed and found to be benign December 13, 2017.  Pap smear November 26, 2017 showed no intraepithelial lesion or malignancy and negative high risk HPV.  Patient has a family history of breast cancer and had genetic testing recently completed with an 43 gene panel with the findings of no significant mutations.  Dr. Quincy Simmonds was considering a hysterectomy with repair for her significant uterovaginal prolapse including the possibility of sacral colpopexy.  Given the history of the fibroid and the possibility of morcellation an MRI was obtained for reassurance.  Unfortunately the MRI dated 01/22/2018 describes the mass as being heterogeneous with internal enhancement not typical of a benign leiomyoma and alternatively the lesion may  be arising from the right ovary.  Given this possibility the patient was referred for management.  Measurement of disease:  No results for input(s): CA125, CAN125, CEA, CA199, ESTRADIOL, INHBB in the last 8760 hours.  Invalid input(s): INHIBINA  . Pending further workup   Radiology: . 01/22/2018-MRI of the pelvis with and without contrast-[facility]-uterus is 7.1 x 3.9 x 4.7 with a normal trilaminar appearance of the endometrium.  A large well-circumscribed mass is identified along the right side of the uterine fundus measuring 13.9 x 5.8 x 6.2.  The mass is predominantly T2 hypointense and T1 hypointense with mild heterogeneous areas of internal enhancement following IV contrast.  The margins of this mass show a septated area of increased fluid signal intensity.  There appears to be a large broad-based attachment to the right side of the uterine fundus which measures 3.8 cm.  The right ovary appears to extend along the right side of the pelvic mass.  There is a normal left ovary.  Impression differential includes atypical appearance of a benign leiomyoma versus malignant lesions.  Overall the appearance is not typical of a benign leiomyoma.  Alternatively the lesion may be arising from the right ovary.    Oncologic History: Pending further workup    No history exists.    Current Meds:  Outpatient Encounter Medications as of 01/29/2018  Medication Sig  . acetaminophen (TYLENOL) 325 MG tablet Take 650 mg by mouth every 4 (four) hours as needed for moderate pain  or headache.  . enalapril (VASOTEC) 10 MG tablet Take 1 tablet (10 mg total) by mouth daily.  . hydrochlorothiazide (HYDRODIURIL) 25 MG tablet Take 1 tablet (25 mg total) by mouth daily.  Marland Kitchen oxymetazoline (AFRIN) 0.05 % nasal spray Place 1 spray into both nostrils daily as needed for congestion.   No facility-administered encounter medications on file as of 01/29/2018.     Allergy: No Known Allergies  Social Hx:   Social  History   Socioeconomic History  . Marital status: Married    Spouse name: Not on file  . Number of children: Not on file  . Years of education: Not on file  . Highest education level: Not on file  Occupational History  . Not on file  Social Needs  . Financial resource strain: Not on file  . Food insecurity:    Worry: Not on file    Inability: Not on file  . Transportation needs:    Medical: Not on file    Non-medical: Not on file  Tobacco Use  . Smoking status: Never Smoker  . Smokeless tobacco: Never Used  Substance and Sexual Activity  . Alcohol use: Yes    Comment: 1/month  . Drug use: No  . Sexual activity: Yes    Birth control/protection: Other-see comments    Comment: Husband had Vasectomy  Lifestyle  . Physical activity:    Days per week: Not on file    Minutes per session: Not on file  . Stress: Not on file  Relationships  . Social connections:    Talks on phone: Not on file    Gets together: Not on file    Attends religious service: Not on file    Active member of club or organization: Not on file    Attends meetings of clubs or organizations: Not on file    Relationship status: Not on file  . Intimate partner violence:    Fear of current or ex partner: Not on file    Emotionally abused: Not on file    Physically abused: Not on file    Forced sexual activity: Not on file  Other Topics Concern  . Not on file  Social History Narrative  . Not on file    Past Surgical Hx:  Past Surgical History:  Procedure Laterality Date  . PILONIDAL CYST EXCISION  1987    Past Medical Hx:  Past Medical History:  Diagnosis Date  . Allergy    seasonal  . Family history of cancer   . Hypertension   . Ovarian cyst    Sounds like this is a misinterpretation and she is referring to her fibroid    Past Gynecological History:   GYNECOLOGIC HISTORY:  No LMP recorded. Patient is perimenopausal. P 3 LMP 11/13/2017 Contraceptive greater than 10 years of OCPs;  currently female partner has a vasectomy HRT none  Last Pap February 2019 normal and on the chart  Family Hx:  Family History  Problem Relation Age of Onset  . Arthritis Mother   . Cancer Mother        glioblastoma  . Varicose Veins Mother   . Arthritis Father   . Diabetes Father   . Hearing loss Father   . Heart disease Father   . Hypertension Father   . Vision loss Father   . Cancer Maternal Grandmother        throat  . Varicose Veins Maternal Grandmother   . Stroke Maternal Grandfather   . Stroke  Paternal Grandfather   . Thyroid disease Sister   . Cancer Maternal Aunt        breast  . Kidney disease Paternal Grandmother   . Breast cancer Cousin        mat cousin, daughter of aunt with breast cancer, dx in her 81s  . Breast cancer Cousin        mat first cousin, daughter of aunt without cancer, dx in her 65s  . Colon cancer Cousin 92       mat cousin; son of mat uncle    Review of Systems:  Review of Systems  Constitutional: Negative.   HENT: Negative.   Eyes: Negative.   Respiratory: Negative.   Cardiovascular: Negative.   Gastrointestinal: Negative.   Genitourinary: Positive for urgency.  Musculoskeletal: Negative.   Skin: Negative.   Neurological: Negative.   Endo/Heme/Allergies: Negative.   Psychiatric/Behavioral: Negative.    Gyn - ? Postmenopausal bleeding  Vitals:  Blood pressure 113/73, pulse 68, temperature 97.8 F (36.6 C), temperature source Oral, resp. rate 18, weight 228 lb 11.2 oz (103.7 kg), SpO2 99 %. Body mass index is 40.51 kg/m.   Physical Exam: ECOG PERFORMANCE STATUS: 1 - Symptomatic but completely ambulatory   General :  Well developed, 57 y.o., female in no apparent distress HEENT:  Normocephalic/atraumatic, symmetric, EOMI, eyelids normal Neck:   Supple, no masses.  Lymphatics:  No cervical/ submandibular/ supraclavicular/ infraclavicular/ inguinal adenopathy Respiratory:  Respirations unlabored, no use of accessory  muscles CV:   Deferred Breast:  Deferred Musculoskeletal: No CVA tenderness, normal muscle strength. Abdomen:  Overweight soft, non-tender and nondistended. No evidence of hernia. No masses. Extremities:  No lymphedema, no erythema, non-tender. Skin:   Normal inspection Neuro/Psych:  No focal motor deficit, no abnormal mental status. Normal gait. Normal affect. Alert and oriented to person, place, and time  Genito Urinary: Vulva: Normal external female genitalia.  Bladder/urethra: Urethral meatus normal in size and location. No lesions or   masses, evidence of a cystocele and uterovaginal prolapse. Speculum exam: Vagina: No lesion, no discharge, no bleeding. Cervix: Normal appearing, no lesions. Bimanual exam:  Uterus: Difficult to delineate size , very mobile. The mass feels somewhat separate to the uterus and slightly posterior to it.   Adnexa: No masses. Rectovaginal:  Good tone, no masses, no cul de sac nodularity, no parametrial involvement or nodularity. There is a smooth regular mass anterior to the rectum but not adherent.   Oncologic Summary: 1. Pelvic mass suspected fibroid possible adnexal mass  Assessment/Plan: Kimberly Wall has a large uterine fundal mass vs ovarian mass. I believe based on my exam that robotic assisted total hysterectomy with BSO is feasible, though she may need minilap for specimen delivery. Frozen section will be sent and additional staging based on findings.  I discussed that LMS tumors can sometimes involve adjacent structures (bowel involvement) in which case she may require ex lap with small bowel resection.    Thereasa Solo, MD  01/29/2018, 9:57 AM  Cc: Josefa Half, MD Dr. Christ[facility] (PCP)

## 2018-01-29 NOTE — Patient Instructions (Signed)
Dr Denman George is planning on a robotic assisted laparoscopic total hysterectomy with removal of tubes and ovaries. She may need to make a small incision on the abdomen to remove the specimen in tact. She will have it tested for cancer (including the type of uterine cancer called leiomyosarcoma) while you are asleep during surgery.

## 2018-01-29 NOTE — H&P (View-Only) (Signed)
H&P Note: Gyn-Onc  Consult was requested by Dr. Quincy Simmonds for the evaluation of Kimberly Wall 57 y.o. female  CC:  Chief Complaint  Patient presents with  . Uterine mass    HPI: Ms. Kimberly Wall  is a very nice 57 y.o.  P3  She has had a long history of pelvic relaxation and reports a long history of fibroids.  As long as 12 years ago she was noted in [place] by her gynecologist to be diagnosed with both of these conditions.  She has been followed here in [facility] by Dr. Juanda Chance with [place] for approximately 12 years.  She has been getting intermittent ultrasound and pelvic exams to follow her "fibroids".  Her insurance changed approximately 1 year ago and her follow-up has been with Dr. Quincy Simmonds since that time.  The patient notes that her last menstrual period was March 2018 with the year prior to that having oligomenorrhea but on occasion bleeding as long as 3 weeks.  She did not go quite a whole year before bleeding again with the most recent bleed being February 2019 for 5 days which seemed to be normal period.  Dr. Quincy Simmonds checked an [facility] and that was 30.4 with an estradiol being 11.1 on 11/26/2017.  Given the question of postmenopausal bleeding and endometrial biopsy was performed and found to be benign December 13, 2017.  Pap smear November 26, 2017 showed no intraepithelial lesion or malignancy and negative high risk HPV.  Patient has a family history of breast cancer and had genetic testing recently completed with an 35 gene panel with the findings of no significant mutations.  Dr. Quincy Simmonds was considering a hysterectomy with repair for her significant uterovaginal prolapse including the possibility of sacral colpopexy.  Given the history of the fibroid and the possibility of morcellation an MRI was obtained for reassurance.  Unfortunately the MRI dated 01/22/2018 describes the mass as being heterogeneous with internal enhancement not typical of a benign leiomyoma and alternatively the lesion may  be arising from the right ovary.  Given this possibility the patient was referred for management.  Measurement of disease:  No results for input(s): CA125, CAN125, CEA, CA199, ESTRADIOL, INHBB in the last 8760 hours.  Invalid input(s): INHIBINA  . Pending further workup   Radiology: . 01/22/2018-MRI of the pelvis with and without contrast- imaging-uterus is 7.1 x 3.9 x 4.7 with a normal trilaminar appearance of the endometrium.  A large well-circumscribed mass is identified along the right side of the uterine fundus measuring 13.9 x 5.8 x 6.2.  The mass is predominantly T2 hypointense and T1 hypointense with mild heterogeneous areas of internal enhancement following IV contrast.  The margins of this mass show a septated area of increased fluid signal intensity.  There appears to be a large broad-based attachment to the right side of the uterine fundus which measures 3.8 cm.  The right ovary appears to extend along the right side of the pelvic mass.  There is a normal left ovary.  Impression differential includes atypical appearance of a benign leiomyoma versus malignant lesions.  Overall the appearance is not typical of a benign leiomyoma.  Alternatively the lesion may be arising from the right ovary.    Oncologic History: Pending further workup    No history exists.    Current Meds:  Outpatient Encounter Medications as of 01/29/2018  Medication Sig  . acetaminophen (TYLENOL) 325 MG tablet Take 650 mg by mouth every 4 (four) hours as needed for moderate pain  or headache.  . enalapril (VASOTEC) 10 MG tablet Take 1 tablet (10 mg total) by mouth daily.  . hydrochlorothiazide (HYDRODIURIL) 25 MG tablet Take 1 tablet (25 mg total) by mouth daily.  Marland Kitchen oxymetazoline (AFRIN) 0.05 % nasal spray Place 1 spray into both nostrils daily as needed for congestion.   No facility-administered encounter medications on file as of 01/29/2018.     Allergy: No Known Allergies  Social Hx:   Social  History   Socioeconomic History  . Marital status: Married    Spouse name: Not on file  . Number of children: Not on file  . Years of education: Not on file  . Highest education level: Not on file  Occupational History  . Not on file  Social Needs  . Financial resource strain: Not on file  . Food insecurity:    Worry: Not on file    Inability: Not on file  . Transportation needs:    Medical: Not on file    Non-medical: Not on file  Tobacco Use  . Smoking status: Never Smoker  . Smokeless tobacco: Never Used  Substance and Sexual Activity  . Alcohol use: Yes    Comment: 1/month  . Drug use: No  . Sexual activity: Yes    Birth control/protection: Other-see comments    Comment: Husband had Vasectomy  Lifestyle  . Physical activity:    Days per week: Not on file    Minutes per session: Not on file  . Stress: Not on file  Relationships  . Social connections:    Talks on phone: Not on file    Gets together: Not on file    Attends religious service: Not on file    Active member of club or organization: Not on file    Attends meetings of clubs or organizations: Not on file    Relationship status: Not on file  . Intimate partner violence:    Fear of current or ex partner: Not on file    Emotionally abused: Not on file    Physically abused: Not on file    Forced sexual activity: Not on file  Other Topics Concern  . Not on file  Social History Narrative  . Not on file    Past Surgical Hx:  Past Surgical History:  Procedure Laterality Date  . PILONIDAL CYST EXCISION  1987    Past Medical Hx:  Past Medical History:  Diagnosis Date  . Allergy    seasonal  . Family history of cancer   . Hypertension   . Ovarian cyst    Sounds like this is a misinterpretation and she is referring to her fibroid    Past Gynecological History:   GYNECOLOGIC HISTORY:  No LMP recorded. Patient is perimenopausal. P 3 LMP 11/13/2017 Contraceptive greater than 10 years of OCPs;  currently female partner has a vasectomy HRT none  Last Pap February 2019 normal and on the chart  Family Hx:  Family History  Problem Relation Age of Onset  . Arthritis Mother   . Cancer Mother        glioblastoma  . Varicose Veins Mother   . Arthritis Father   . Diabetes Father   . Hearing loss Father   . Heart disease Father   . Hypertension Father   . Vision loss Father   . Cancer Maternal Grandmother        throat  . Varicose Veins Maternal Grandmother   . Stroke Maternal Grandfather   . Stroke  Paternal Grandfather   . Thyroid disease Sister   . Cancer Maternal Aunt        breast  . Kidney disease Paternal Grandmother   . Breast cancer Cousin        mat cousin, daughter of aunt with breast cancer, dx in her 39s  . Breast cancer Cousin        mat first cousin, daughter of aunt without cancer, dx in her 23s  . Colon cancer Cousin 58       mat cousin; son of mat uncle    Review of Systems:  Review of Systems  Constitutional: Negative.   HENT: Negative.   Eyes: Negative.   Respiratory: Negative.   Cardiovascular: Negative.   Gastrointestinal: Negative.   Genitourinary: Positive for urgency.  Musculoskeletal: Negative.   Skin: Negative.   Neurological: Negative.   Endo/Heme/Allergies: Negative.   Psychiatric/Behavioral: Negative.    Gyn - ? Postmenopausal bleeding  Vitals:  Blood pressure 113/73, pulse 68, temperature 97.8 F (36.6 C), temperature source Oral, resp. rate 18, weight 228 lb 11.2 oz (103.7 kg), SpO2 99 %. Body mass index is 40.51 kg/m.   Physical Exam: ECOG PERFORMANCE STATUS: 1 - Symptomatic but completely ambulatory   General :  Well developed, 57 y.o., female in no apparent distress HEENT:  Normocephalic/atraumatic, symmetric, EOMI, eyelids normal Neck:   Supple, no masses.  Lymphatics:  No cervical/ submandibular/ supraclavicular/ infraclavicular/ inguinal adenopathy Respiratory:  Respirations unlabored, no use of accessory  muscles CV:   Deferred Breast:  Deferred Musculoskeletal: No CVA tenderness, normal muscle strength. Abdomen:  Overweight soft, non-tender and nondistended. No evidence of hernia. No masses. Extremities:  No lymphedema, no erythema, non-tender. Skin:   Normal inspection Neuro/Psych:  No focal motor deficit, no abnormal mental status. Normal gait. Normal affect. Alert and oriented to person, place, and time  Genito Urinary: Vulva: Normal external female genitalia.  Bladder/urethra: Urethral meatus normal in size and location. No lesions or   masses, evidence of a cystocele and uterovaginal prolapse. Speculum exam: Vagina: No lesion, no discharge, no bleeding. Cervix: Normal appearing, no lesions. Bimanual exam:  Uterus: Difficult to delineate size , very mobile. The mass feels somewhat separate to the uterus and slightly posterior to it.   Adnexa: No masses. Rectovaginal:  Good tone, no masses, no cul de sac nodularity, no parametrial involvement or nodularity. There is a smooth regular mass anterior to the rectum but not adherent.   Oncologic Summary: 1. Pelvic mass suspected fibroid possible adnexal mass  Assessment/Plan: Ms Lafuente has a large uterine fundal mass vs ovarian mass. I believe based on my exam that robotic assisted total hysterectomy with BSO is feasible, though she may need minilap for specimen delivery. Frozen section will be sent and additional staging based on findings.  I discussed that LMS tumors can sometimes involve adjacent structures (bowel involvement) in which case she may require ex lap with small bowel resection.    Thereasa Solo, MD  01/29/2018, 9:57 AM  Cc: Josefa Half, MD Dr. [facility] (PCP)

## 2018-01-30 ENCOUNTER — Telehealth: Payer: Self-pay

## 2018-01-30 LAB — CA 125: Cancer Antigen (CA) 125: 6.4 U/mL (ref 0.0–38.1)

## 2018-01-30 NOTE — Telephone Encounter (Signed)
Outgoing call to patient to let her know per Joylene John NP  Her CA 125 is within normal limits.  Pt voiced understanding, no other needs per pt at this time.

## 2018-01-31 ENCOUNTER — Encounter (HOSPITAL_COMMUNITY): Payer: Self-pay | Admitting: Emergency Medicine

## 2018-01-31 ENCOUNTER — Ambulatory Visit (HOSPITAL_COMMUNITY): Payer: No Typology Code available for payment source | Admitting: Registered Nurse

## 2018-01-31 ENCOUNTER — Other Ambulatory Visit: Payer: Self-pay

## 2018-01-31 ENCOUNTER — Ambulatory Visit (HOSPITAL_COMMUNITY)
Admission: RE | Admit: 2018-01-31 | Discharge: 2018-02-01 | Disposition: A | Discharge status: Home / Self Care | Payer: No Typology Code available for payment source | Source: Ambulatory Visit | Attending: Gynecologic Oncology | Admitting: Gynecologic Oncology

## 2018-01-31 ENCOUNTER — Encounter (HOSPITAL_COMMUNITY)
Admission: RE | Disposition: A | Discharge status: Home / Self Care | Payer: Self-pay | Source: Ambulatory Visit | Attending: Gynecologic Oncology

## 2018-01-31 DIAGNOSIS — I10 Essential (primary) hypertension: Secondary | ICD-10-CM | POA: Insufficient documentation

## 2018-01-31 DIAGNOSIS — N838 Other noninflammatory disorders of ovary, fallopian tube and broad ligament: Secondary | ICD-10-CM | POA: Insufficient documentation

## 2018-01-31 DIAGNOSIS — Z79899 Other long term (current) drug therapy: Secondary | ICD-10-CM | POA: Insufficient documentation

## 2018-01-31 DIAGNOSIS — D252 Subserosal leiomyoma of uterus: Secondary | ICD-10-CM | POA: Diagnosis not present

## 2018-01-31 DIAGNOSIS — R1900 Intra-abdominal and pelvic swelling, mass and lump, unspecified site: Secondary | ICD-10-CM | POA: Diagnosis present

## 2018-01-31 DIAGNOSIS — D219 Benign neoplasm of connective and other soft tissue, unspecified: Secondary | ICD-10-CM | POA: Diagnosis present

## 2018-01-31 HISTORY — PX: ROBOTIC ASSISTED TOTAL HYSTERECTOMY WITH BILATERAL SALPINGO OOPHERECTOMY: SHX6086

## 2018-01-31 LAB — TYPE AND SCREEN
ABO/RH(D): A POS
ANTIBODY SCREEN: NEGATIVE

## 2018-01-31 SURGERY — HYSTERECTOMY, TOTAL, ROBOT-ASSISTED, LAPAROSCOPIC, WITH BILATERAL SALPINGO-OOPHORECTOMY
Anesthesia: General | Laterality: Bilateral

## 2018-01-31 MED ORDER — TRAMADOL HCL 50 MG PO TABS: 100.0000 mg | ORAL_TABLET | Freq: Four times a day (QID) | ORAL | Status: DC | PRN

## 2018-01-31 MED ORDER — LACTATED RINGERS IV SOLN
INTRAVENOUS | Status: DC | PRN
  Administered 2018-01-31 (×2): via INTRAVENOUS

## 2018-01-31 MED ORDER — DEXAMETHASONE SODIUM PHOSPHATE 4 MG/ML IJ SOLN
4.0000 mg | INTRAMUSCULAR | Status: AC
  Administered 2018-01-31: 10 mg via INTRAVENOUS

## 2018-01-31 MED ORDER — ACETAMINOPHEN 500 MG PO TABS
1000.0000 mg | ORAL_TABLET | Freq: Four times a day (QID) | ORAL | Status: DC
  Administered 2018-01-31 – 2018-02-01 (×4): 1000 mg via ORAL
  Filled 2018-01-31 (×4): qty 2

## 2018-01-31 MED ORDER — ENALAPRIL MALEATE 10 MG PO TABS
10.0000 mg | ORAL_TABLET | Freq: Every day | ORAL | Status: DC
  Administered 2018-02-01: 10 mg via ORAL
  Filled 2018-01-31: qty 1

## 2018-01-31 MED ORDER — DEXAMETHASONE SODIUM PHOSPHATE 10 MG/ML IJ SOLN
INTRAMUSCULAR | Status: DC | PRN
  Administered 2018-01-31: 10 mg via INTRAVENOUS

## 2018-01-31 MED ORDER — MIDAZOLAM HCL 5 MG/5ML IJ SOLN
INTRAMUSCULAR | Status: DC | PRN
  Administered 2018-01-31: 2 mg via INTRAVENOUS

## 2018-01-31 MED ORDER — CEFAZOLIN SODIUM-DEXTROSE 2-4 GM/100ML-% IV SOLN
2.0000 g | INTRAVENOUS | Status: AC
  Administered 2018-01-31: 2 g via INTRAVENOUS
  Filled 2018-01-31: qty 100

## 2018-01-31 MED ORDER — PHENYLEPHRINE 40 MCG/ML (10ML) SYRINGE FOR IV PUSH (FOR BLOOD PRESSURE SUPPORT)
PREFILLED_SYRINGE | INTRAVENOUS | Status: AC
  Filled 2018-01-31: qty 10

## 2018-01-31 MED ORDER — SUGAMMADEX SODIUM 200 MG/2ML IV SOLN
INTRAVENOUS | Status: DC | PRN
  Administered 2018-01-31: 200 mg via INTRAVENOUS

## 2018-01-31 MED ORDER — LIDOCAINE 2% (20 MG/ML) 5 ML SYRINGE
INTRAMUSCULAR | Status: DC | PRN
  Administered 2018-01-31: 25 mg via INTRAVENOUS
  Administered 2018-01-31: 75 mg via INTRAVENOUS

## 2018-01-31 MED ORDER — HYDROMORPHONE HCL 1 MG/ML IJ SOLN: 0.2000 mg | INTRAMUSCULAR | Status: DC | PRN

## 2018-01-31 MED ORDER — ONDANSETRON HCL 4 MG/2ML IJ SOLN: 4.0000 mg | Freq: Four times a day (QID) | INTRAMUSCULAR | Status: DC | PRN

## 2018-01-31 MED ORDER — PROPOFOL 10 MG/ML IV BOLUS
INTRAVENOUS | Status: DC | PRN
  Administered 2018-01-31: 200 mg via INTRAVENOUS
  Administered 2018-01-31: 30 mg via INTRAVENOUS
  Administered 2018-01-31: 50 mg via INTRAVENOUS

## 2018-01-31 MED ORDER — MIDAZOLAM HCL 2 MG/2ML IJ SOLN
INTRAMUSCULAR | Status: AC
  Filled 2018-01-31: qty 2

## 2018-01-31 MED ORDER — SUGAMMADEX SODIUM 200 MG/2ML IV SOLN
INTRAVENOUS | Status: AC
  Filled 2018-01-31: qty 2

## 2018-01-31 MED ORDER — ROCURONIUM BROMIDE 10 MG/ML (PF) SYRINGE
PREFILLED_SYRINGE | INTRAVENOUS | Status: AC
  Filled 2018-01-31: qty 5

## 2018-01-31 MED ORDER — PHENYLEPHRINE HCL 10 MG/ML IJ SOLN
INTRAMUSCULAR | Status: DC | PRN
  Administered 2018-01-31: 120 ug via INTRAVENOUS
  Administered 2018-01-31: 80 ug via INTRAVENOUS

## 2018-01-31 MED ORDER — GABAPENTIN 300 MG PO CAPS
300.0000 mg | ORAL_CAPSULE | ORAL | Status: AC
  Administered 2018-01-31: 300 mg via ORAL
  Filled 2018-01-31: qty 1

## 2018-01-31 MED ORDER — STERILE WATER FOR IRRIGATION IR SOLN
Status: DC | PRN
  Administered 2018-01-31: 1000 mL

## 2018-01-31 MED ORDER — GABAPENTIN 300 MG PO CAPS
600.0000 mg | ORAL_CAPSULE | Freq: Every day | ORAL | Status: AC
  Administered 2018-01-31: 600 mg via ORAL
  Filled 2018-01-31: qty 2

## 2018-01-31 MED ORDER — ACETAMINOPHEN 500 MG PO TABS
1000.0000 mg | ORAL_TABLET | ORAL | Status: AC
  Administered 2018-01-31: 1000 mg via ORAL
  Filled 2018-01-31: qty 2

## 2018-01-31 MED ORDER — SENNOSIDES-DOCUSATE SODIUM 8.6-50 MG PO TABS
2.0000 | ORAL_TABLET | Freq: Every day | ORAL | Status: DC
  Administered 2018-01-31: 2 via ORAL
  Filled 2018-01-31: qty 2

## 2018-01-31 MED ORDER — ENOXAPARIN SODIUM 40 MG/0.4ML ~~LOC~~ SOLN
40.0000 mg | SUBCUTANEOUS | Status: DC
  Administered 2018-02-01: 40 mg via SUBCUTANEOUS
  Filled 2018-01-31: qty 0.4

## 2018-01-31 MED ORDER — FENTANYL CITRATE (PF) 100 MCG/2ML IJ SOLN
INTRAMUSCULAR | Status: AC
  Filled 2018-01-31: qty 2

## 2018-01-31 MED ORDER — ONDANSETRON HCL 4 MG/2ML IJ SOLN
INTRAMUSCULAR | Status: DC | PRN
  Administered 2018-01-31: 4 mg via INTRAVENOUS

## 2018-01-31 MED ORDER — SODIUM CHLORIDE 0.9 % IJ SOLN
INTRAMUSCULAR | Status: AC
  Filled 2018-01-31: qty 10

## 2018-01-31 MED ORDER — SUFENTANIL CITRATE 50 MCG/ML IV SOLN
INTRAVENOUS | Status: AC
  Filled 2018-01-31: qty 1

## 2018-01-31 MED ORDER — HYDROMORPHONE HCL 1 MG/ML IJ SOLN
INTRAMUSCULAR | Status: DC | PRN
  Administered 2018-01-31: 0.5 mg via INTRAVENOUS

## 2018-01-31 MED ORDER — SUFENTANIL CITRATE 50 MCG/ML IV SOLN
INTRAVENOUS | Status: DC | PRN
  Administered 2018-01-31 (×2): 5 ug via INTRAVENOUS
  Administered 2018-01-31 (×2): 10 ug via INTRAVENOUS

## 2018-01-31 MED ORDER — SCOPOLAMINE 1 MG/3DAYS TD PT72
1.0000 | MEDICATED_PATCH | TRANSDERMAL | Status: DC
  Administered 2018-01-31: 1.5 mg via TRANSDERMAL
  Filled 2018-01-31: qty 1

## 2018-01-31 MED ORDER — METOCLOPRAMIDE HCL 5 MG/ML IJ SOLN: 10.0000 mg | Freq: Once | INTRAMUSCULAR | Status: DC | PRN

## 2018-01-31 MED ORDER — LIDOCAINE 2% (20 MG/ML) 5 ML SYRINGE
INTRAMUSCULAR | Status: AC
  Filled 2018-01-31: qty 5

## 2018-01-31 MED ORDER — SUCCINYLCHOLINE CHLORIDE 200 MG/10ML IV SOSY
PREFILLED_SYRINGE | INTRAVENOUS | Status: DC | PRN
  Administered 2018-01-31: 140 mg via INTRAVENOUS

## 2018-01-31 MED ORDER — SUCCINYLCHOLINE CHLORIDE 200 MG/10ML IV SOSY
PREFILLED_SYRINGE | INTRAVENOUS | Status: AC
  Filled 2018-01-31: qty 10

## 2018-01-31 MED ORDER — FENTANYL CITRATE (PF) 100 MCG/2ML IJ SOLN
25.0000 ug | INTRAMUSCULAR | Status: DC | PRN
  Administered 2018-01-31: 50 ug via INTRAVENOUS

## 2018-01-31 MED ORDER — ONDANSETRON HCL 4 MG/2ML IJ SOLN: INTRAMUSCULAR | Status: DC | PRN

## 2018-01-31 MED ORDER — EPHEDRINE SULFATE 50 MG/ML IJ SOLN
INTRAMUSCULAR | Status: DC | PRN
  Administered 2018-01-31: 5 mg via INTRAVENOUS

## 2018-01-31 MED ORDER — LACTATED RINGERS IR SOLN
Status: DC | PRN
  Administered 2018-01-31: 3000 mL

## 2018-01-31 MED ORDER — OXYCODONE HCL 5 MG PO TABS: 5.0000 mg | ORAL_TABLET | ORAL | Status: DC | PRN

## 2018-01-31 MED ORDER — ONDANSETRON HCL 4 MG PO TABS: 4.0000 mg | ORAL_TABLET | Freq: Four times a day (QID) | ORAL | Status: DC | PRN

## 2018-01-31 MED ORDER — DEXAMETHASONE SODIUM PHOSPHATE 10 MG/ML IJ SOLN
INTRAMUSCULAR | Status: AC
  Filled 2018-01-31: qty 1

## 2018-01-31 MED ORDER — HYDROMORPHONE HCL 2 MG/ML IJ SOLN
INTRAMUSCULAR | Status: AC
  Filled 2018-01-31: qty 1

## 2018-01-31 MED ORDER — ONDANSETRON HCL 4 MG/2ML IJ SOLN
INTRAMUSCULAR | Status: AC
  Filled 2018-01-31: qty 2

## 2018-01-31 MED ORDER — PROPOFOL 10 MG/ML IV BOLUS
INTRAVENOUS | Status: AC
  Filled 2018-01-31: qty 40

## 2018-01-31 MED ORDER — LACTATED RINGERS IV SOLN: INTRAVENOUS | Status: DC

## 2018-01-31 MED ORDER — MEPERIDINE HCL 50 MG/ML IJ SOLN: 6.2500 mg | INTRAMUSCULAR | Status: DC | PRN

## 2018-01-31 MED ORDER — ROCURONIUM BROMIDE 10 MG/ML (PF) SYRINGE
PREFILLED_SYRINGE | INTRAVENOUS | Status: DC | PRN
  Administered 2018-01-31: 55 mg via INTRAVENOUS
  Administered 2018-01-31: 5 mg via INTRAVENOUS
  Administered 2018-01-31: 10 mg via INTRAVENOUS

## 2018-01-31 MED ORDER — ENOXAPARIN SODIUM 40 MG/0.4ML ~~LOC~~ SOLN
40.0000 mg | SUBCUTANEOUS | Status: AC
  Administered 2018-01-31: 40 mg via SUBCUTANEOUS
  Filled 2018-01-31: qty 0.4

## 2018-01-31 MED ORDER — KCL IN DEXTROSE-NACL 20-5-0.45 MEQ/L-%-% IV SOLN
INTRAVENOUS | Status: DC
  Administered 2018-01-31 (×2): via INTRAVENOUS
  Filled 2018-01-31: qty 1000

## 2018-01-31 MED ORDER — CELECOXIB 200 MG PO CAPS
400.0000 mg | ORAL_CAPSULE | ORAL | Status: AC
  Administered 2018-01-31: 400 mg via ORAL
  Filled 2018-01-31: qty 2

## 2018-01-31 MED ORDER — EPHEDRINE 5 MG/ML INJ
INTRAVENOUS | Status: AC
  Filled 2018-01-31: qty 10

## 2018-01-31 SURGICAL SUPPLY — 48 items
ADH SKN CLS APL DERMABOND .7 (GAUZE/BANDAGES/DRESSINGS) ×1
AGENT HMST KT MTR STRL THRMB (HEMOSTASIS)
APL ESCP 34 STRL LF DISP (HEMOSTASIS)
APPLICATOR SURGIFLO ENDO (HEMOSTASIS) IMPLANT
BAG LAPAROSCOPIC 12 15 PORT 16 (BASKET) IMPLANT
BAG RETRIEVAL 12/15 (BASKET)
BAG SPEC RTRVL LRG 6X4 10 (ENDOMECHANICALS)
COVER BACK TABLE 60X90IN (DRAPES) ×2 IMPLANT
COVER TIP SHEARS 8 DVNC (MISCELLANEOUS) ×1 IMPLANT
COVER TIP SHEARS 8MM DA VINCI (MISCELLANEOUS) ×1
DERMABOND ADVANCED (GAUZE/BANDAGES/DRESSINGS) ×1
DERMABOND ADVANCED .7 DNX12 (GAUZE/BANDAGES/DRESSINGS) ×1 IMPLANT
DRAPE ARM DVNC X/XI (DISPOSABLE) ×4 IMPLANT
DRAPE COLUMN DVNC XI (DISPOSABLE) ×1 IMPLANT
DRAPE DA VINCI XI ARM (DISPOSABLE) ×4
DRAPE DA VINCI XI COLUMN (DISPOSABLE) ×1
DRAPE SHEET LG 3/4 BI-LAMINATE (DRAPES) ×2 IMPLANT
DRAPE SURG IRRIG POUCH 19X23 (DRAPES) ×2 IMPLANT
ELECT REM PT RETURN 15FT ADLT (MISCELLANEOUS) ×2 IMPLANT
GLOVE BIO SURGEON STRL SZ 6 (GLOVE) ×8 IMPLANT
GLOVE BIO SURGEON STRL SZ 6.5 (GLOVE) ×4 IMPLANT
GOWN STRL REUS W/ TWL LRG LVL3 (GOWN DISPOSABLE) ×2 IMPLANT
GOWN STRL REUS W/TWL LRG LVL3 (GOWN DISPOSABLE) ×6
HOLDER FOLEY CATH W/STRAP (MISCELLANEOUS) ×2 IMPLANT
IRRIG SUCT STRYKERFLOW 2 WTIP (MISCELLANEOUS) ×2
IRRIGATION SUCT STRKRFLW 2 WTP (MISCELLANEOUS) ×1 IMPLANT
KIT PROCEDURE DA VINCI SI (MISCELLANEOUS)
KIT PROCEDURE DVNC SI (MISCELLANEOUS) IMPLANT
MANIPULATOR UTERINE 4.5 ZUMI (MISCELLANEOUS) ×2 IMPLANT
NDL SPNL 18GX3.5 QUINCKE PK (NEEDLE) ×1 IMPLANT
NEEDLE SPNL 18GX3.5 QUINCKE PK (NEEDLE) IMPLANT
OBTURATOR OPTICAL STANDARD 8MM (TROCAR) ×1
OBTURATOR OPTICAL STND 8 DVNC (TROCAR) ×1
OBTURATOR OPTICALSTD 8 DVNC (TROCAR) ×1 IMPLANT
PACK ROBOT GYN CUSTOM WL (TRAY / TRAY PROCEDURE) ×2 IMPLANT
PAD POSITIONING PINK XL (MISCELLANEOUS) ×2 IMPLANT
POUCH SPECIMEN RETRIEVAL 10MM (ENDOMECHANICALS) IMPLANT
SEAL CANN UNIV 5-8 DVNC XI (MISCELLANEOUS) ×4 IMPLANT
SEAL XI 5MM-8MM UNIVERSAL (MISCELLANEOUS) ×4
SET TRI-LUMEN FLTR TB AIRSEAL (TUBING) ×2 IMPLANT
SURGIFLO W/THROMBIN 8M KIT (HEMOSTASIS) IMPLANT
SUT VIC AB 0 CT1 27 (SUTURE)
SUT VIC AB 0 CT1 27XBRD ANTBC (SUTURE) IMPLANT
SYR 10ML LL (SYRINGE) ×2 IMPLANT
TOWEL OR NON WOVEN STRL DISP B (DISPOSABLE) ×2 IMPLANT
TRAP SPECIMEN MUCOUS 40CC (MISCELLANEOUS) IMPLANT
TRAY FOLEY W/METER SILVER 16FR (SET/KITS/TRAYS/PACK) ×2 IMPLANT
UNDERPAD 30X30 (UNDERPADS AND DIAPERS) ×2 IMPLANT

## 2018-01-31 NOTE — Anesthesia Procedure Notes (Signed)
Procedure Name: Intubation Date/Time: 01/31/2018 7:30 AM Performed by: Lissa Morales, CRNA Pre-anesthesia Checklist: Patient identified, Emergency Drugs available, Suction available and Patient being monitored Patient Re-evaluated:Patient Re-evaluated prior to induction Oxygen Delivery Method: Circle system utilized Preoxygenation: Pre-oxygenation with 100% oxygen Induction Type: IV induction Ventilation: Mask ventilation without difficulty Laryngoscope Size: Mac and 4 Grade View: Grade II Tube type: Oral Tube size: 7.0 mm Number of attempts: 1 Airway Equipment and Method: Stylet and Oral airway Placement Confirmation: ETT inserted through vocal cords under direct vision,  positive ETCO2 and breath sounds checked- equal and bilateral Secured at: 20 cm Tube secured with: Tape Dental Injury: Teeth and Oropharynx as per pre-operative assessment  Difficulty Due To: Difficulty was anticipated, Difficult Airway- due to large tongue and Difficult Airway- due to limited oral opening

## 2018-01-31 NOTE — Anesthesia Preprocedure Evaluation (Signed)
Anesthesia Evaluation  Patient identified by MRN, date of birth, ID band Patient awake    Reviewed: Allergy & Precautions, NPO status , Patient's Chart, lab work & pertinent test results  History of Anesthesia Complications (+) PONV  Airway Mallampati: II  TM Distance: >3 FB Neck ROM: Full    Dental no notable dental hx.    Pulmonary neg pulmonary ROS,    Pulmonary exam normal breath sounds clear to auscultation       Cardiovascular hypertension, Pt. on medications Normal cardiovascular exam Rhythm:Regular Rate:Normal     Neuro/Psych negative neurological ROS  negative psych ROS   GI/Hepatic negative GI ROS, Neg liver ROS,   Endo/Other  negative endocrine ROS  Renal/GU negative Renal ROS  negative genitourinary   Musculoskeletal negative musculoskeletal ROS (+)   Abdominal   Peds negative pediatric ROS (+)  Hematology negative hematology ROS (+)   Anesthesia Other Findings   Reproductive/Obstetrics negative OB ROS                             Anesthesia Physical Anesthesia Plan  ASA: II  Anesthesia Plan: General   Post-op Pain Management:    Induction: Intravenous  PONV Risk Score and Plan: 4 or greater and Ondansetron, Dexamethasone, Midazolam and Scopolamine patch - Pre-op  Airway Management Planned: Oral ETT  Additional Equipment:   Intra-op Plan:   Post-operative Plan: Extubation in OR  Informed Consent: I have reviewed the patients History and Physical, chart, labs and discussed the procedure including the risks, benefits and alternatives for the proposed anesthesia with the patient or authorized representative who has indicated his/her understanding and acceptance.   Dental advisory given  Plan Discussed with: CRNA  Anesthesia Plan Comments:         Anesthesia Quick Evaluation

## 2018-01-31 NOTE — Op Note (Signed)
OPERATIVE NOTE 01/31/18  Surgeon: Donaciano Eva   Assistants: Dr Lahoma Crocker (an MD assistant was necessary for tissue manipulation, management of robotic instrumentation, retraction and positioning due to the complexity of the case and hospital policies).   Anesthesia: General endotracheal anesthesia  ASA Class: 3   Pre-operative Diagnosis: pelvic mass, suspected subserosal fibroid.  Post-operative Diagnosis: right ovarian stromal cell tumor (benign).   Operation: Robotic-assisted laparoscopic total hysterectomy >250gm with bilateral salpingoophorectomy   Surgeon: Donaciano Eva  Assistant Surgeon: Lahoma Crocker MD  Anesthesia: GET  Urine Output: 300cc  Operative Findings:  : 8cm uterine body with large exophytic 14cm mass extruding from/attached to the fundus. Grossly normal right ovary. Tube on right splayed over the mass. Normal left ovary.   Frozen section revealed a right ovarian stromal cell tumor (benign).   Estimated Blood Loss:  <20cc      Total IV Fluids: 500 ml         Specimens: uterus, cervix, bilateral tubes and ovaries.         Complications:  None; patient tolerated the procedure well.         Disposition: PACU - hemodynamically stable.  Procedure Details  The patient was seen in the Holding Room. The risks, benefits, complications, treatment options, and expected outcomes were discussed with the patient.  The patient concurred with the proposed plan, giving informed consent.  The site of surgery properly noted/marked. The patient was identified as Kimberly Wall and the procedure verified as a Robotic-assisted hysterectomy with bilateral salpingo oophorectomy. A Time Out was held and the above information confirmed.  After induction of anesthesia, the patient was draped and prepped in the usual sterile manner. Pt was placed in supine position after anesthesia and draped and prepped in the usual sterile manner. The abdominal drape was  placed after the CholoraPrep had been allowed to dry for 3 minutes.  Her arms were tucked to her side with all appropriate precautions.  The shoulders were stabilized with padded shoulder blocks applied to the acromium processes.  The patient was placed in the semi-lithotomy position in [place].  The perineum was prepped with Betadine. The patient was then prepped. Foley catheter was placed.  A sterile speculum was placed in the vagina.  The cervix was grasped with a single-tooth tenaculum and dilated with Kennon Rounds dilators.  The ZUMI uterine manipulator with a medium colpotomizer ring was placed without difficulty.  A pneum occluder balloon was placed over the manipulator.  OG tube placement was confirmed and to suction.   Next, a 5 mm skin incision was made 1 cm below the subcostal margin in the midclavicular line.  The 5 mm Optiview port and scope was used for direct entry.  Opening pressure was under 10 mm CO2.  The abdomen was insufflated and the findings were noted as above.   At this point and all points during the procedure, the patient's intra-abdominal pressure did not exceed 15 mmHg. Next, a 10 mm skin incision was made 5cm above the umbilicus and a right and left port was placed about 10 cm lateral to the robot port on the right and left side.  A fourth arm was placed in the left lower quadrant 2 cm above and superior and medial to the anterior superior iliac spine.  All ports were placed under direct visualization.  The patient was placed in steep Trendelenburg.  Bowel was folded away into the upper abdomen.  The robot was docked in the normal  manner.  The hysterectomy was started after the round ligament on the right side was incised and the retroperitoneum was entered and the pararectal space was developed.  The ureter was noted to be on the medial leaf of the broad ligament.  The peritoneum above the ureter was incised and stretched and the infundibulopelvic ligament was skeletonized,  cauterized and cut.  The posterior peritoneum was taken down to the level of the KOH ring.  The anterior peritoneum was also taken down.  The bladder flap was created to the level of the KOH ring.  The uterine artery on the right side was skeletonized, cauterized and cut in the normal manner.  A similar procedure was performed on the left.  The colpotomy was made and the uterus, cervix, bilateral ovaries and tubes were amputated and delivered through the vagina. The delivery of the specimen took approximately 10 minutes due to the large size of the mass. It was delivered in tact in one piece without morcellation.  Pedicles were inspected and excellent hemostasis was achieved.    The colpotomy at the vaginal cuff was closed with Vicryl on a CT1 needle in a running manner.  Irrigation was used and excellent hemostasis was achieved.  At this point in the procedure was completed.  Robotic instruments were removed under direct visulaization.  The robot was undocked. The 10 mm ports were closed with Vicryl on a UR-5 needle and the fascia was closed with 0 Vicryl on a UR-5 needle.  The skin was closed with 4-0 Vicryl in a subcuticular manner.  Dermabond was applied.  Sponge, lap and needle counts correct x 2.  The patient was taken to the recovery room in stable condition.  The vagina was swabbed with  minimal bleeding noted.   All instrument and needle counts were correct x  3.   The patient was transferred to the recovery room in a stable condition.  Donaciano Eva, MD

## 2018-01-31 NOTE — Anesthesia Postprocedure Evaluation (Signed)
Anesthesia Post Note  Patient: Kimberly Wall  Procedure(s) Performed: XI ROBOTIC ASSISTED TOTAL HYSTERECTOMY (FOR UTERUS GREATER THAN  250 GRAMS)WITH BILATERAL SALPINGO OOPHORECTOMY (Bilateral )     Patient location during evaluation: PACU Anesthesia Type: General Level of consciousness: awake and alert Pain management: pain level controlled Vital Signs Assessment: post-procedure vital signs reviewed and stable Respiratory status: spontaneous breathing, nonlabored ventilation, respiratory function stable and patient connected to nasal cannula oxygen Cardiovascular status: blood pressure returned to baseline and stable Postop Assessment: no apparent nausea or vomiting Anesthetic complications: no    Last Vitals:  Vitals:   01/31/18 1115 01/31/18 1245  BP: 138/87 129/88  Pulse: (!) 54 (!) 56  Resp: 13 12  Temp: 36.5 C 36.6 C  SpO2: 100% 100%    Last Pain:  Vitals:   01/31/18 1245  TempSrc: Axillary  PainSc:                  [name]

## 2018-01-31 NOTE — Transfer of Care (Signed)
Immediate Anesthesia Transfer of Care Note  Patient: Kimberly Wall  Procedure(s) Performed: XI ROBOTIC ASSISTED TOTAL HYSTERECTOMY (FOR UTERUS GREATER THAN  250 GRAMS)WITH BILATERAL SALPINGO OOPHORECTOMY (Bilateral )  Patient Location: PACU  Anesthesia Type:General  Level of Consciousness: awake, alert , oriented and patient cooperative  Airway & Oxygen Therapy: Patient Spontanous Breathing and Patient connected to face mask oxygen  Post-op Assessment: Report given to RN, Post -op Vital signs reviewed and stable and Patient moving all extremities X 4  Post vital signs: stable  Last Vitals:  Vitals Value Taken Time  BP 132/79 01/31/2018  9:17 AM  Temp    Pulse 57 01/31/2018  9:20 AM  Resp 18 01/31/2018  9:20 AM  SpO2 100 % 01/31/2018  9:20 AM  Vitals shown include unvalidated device data.  Last Pain:  Vitals:   01/31/18 0618  TempSrc:   PainSc: 0-No pain      Patients Stated Pain Goal: 4 (08/09/27 1188)  Complications: No apparent anesthesia complications

## 2018-01-31 NOTE — Telephone Encounter (Signed)
Pre cert done 0-93-26.

## 2018-01-31 NOTE — Plan of Care (Signed)
Plan of care discussed with patient and family 

## 2018-01-31 NOTE — Interval H&P Note (Signed)
History and Physical Interval Note:  01/31/2018 6:34 AM  Kimberly Wall  has presented today for surgery, with the diagnosis of pelvic mass  The various methods of treatment have been discussed with the patient and family. After consideration of risks, benefits and other options for treatment, the patient has consented to  Procedure(s): XI ROBOTIC ASSISTED TOTAL HYSTERECTOMY WITH BILATERAL SALPINGO OOPHORECTOMY, POSSIBLE EXPLORATORY LAPAROTOMY, POSSIBLE STAGING (Bilateral) as a surgical intervention .  The patient's history has been reviewed, patient examined, no change in status, stable for surgery.  I have reviewed the patient's chart and labs.  Questions were answered to the patient's satisfaction.     Thereasa Solo

## 2018-02-01 DIAGNOSIS — D252 Subserosal leiomyoma of uterus: Secondary | ICD-10-CM | POA: Diagnosis not present

## 2018-02-01 LAB — BASIC METABOLIC PANEL
ANION GAP: 10 (ref 5–15)
BUN: 12 mg/dL (ref 6–20)
CALCIUM: 8.9 mg/dL (ref 8.9–10.3)
CO2: 25 mmol/L (ref 22–32)
Chloride: 106 mmol/L (ref 101–111)
Creatinine, Ser: 0.74 mg/dL (ref 0.44–1.00)
GFR calc Af Amer: >60 mL/min (ref >60)
GLUCOSE: 149 mg/dL — AB (ref 65–99)
Potassium: 3.8 mmol/L (ref 3.5–5.1)
Sodium: 141 mmol/L (ref 135–145)

## 2018-02-01 LAB — CBC
HEMATOCRIT: 41.9 % (ref 36.0–46.0)
Hemoglobin: 13.5 g/dL (ref 12.0–15.0)
MCH: 30.1 pg (ref 26.0–34.0)
MCHC: 32.2 g/dL (ref 30.0–36.0)
MCV: 93.5 fL (ref 78.0–100.0)
PLATELETS: 221 10*3/uL (ref 150–400)
RBC: 4.48 MIL/uL (ref 3.87–5.11)
RDW: 13.2 % (ref 11.5–15.5)
WBC: 13.6 10*3/uL — AB (ref 4.0–10.5)

## 2018-02-01 MED ORDER — OXYCODONE HCL 5 MG PO TABS: 5.0000 mg | ORAL_TABLET | ORAL | 0 refills | Status: DC | PRN

## 2018-02-01 MED FILL — oxyCODONE HCL 5 MG TABS: 5 | 5 days supply | Qty: 30 | Fill #0

## 2018-02-01 NOTE — Progress Notes (Signed)
Patient discharged home. Discharge instructions given to patient and spouse, both verbalized understanding and had no questions. Patient left the floor via wheelchair, in stable condition, with all her belongings, accompanied by staff.

## 2018-02-01 NOTE — Discharge Summary (Signed)
Physician Discharge Summary  Patient ID: Kimberly Wall MRN: [id number] DOB/AGE: [birth year] 57 y.o.  Admit date: 01/31/2018 Discharge date: 02/01/2018  Admission Diagnoses: Subserous leiomyoma of uterus  Discharge Diagnoses:  Principal Problem:   Subserous leiomyoma of uterus Active Problems:   Fibroid   Pelvic mass   Discharged Condition:  The patient is in good condition and stable for discharge.    Hospital Course: On 01/31/2018, the patient underwent the following: Procedure(s): XI ROBOTIC ASSISTED TOTAL HYSTERECTOMY (FOR UTERUS GREATER THAN  250 GRAMS)WITH BILATERAL SALPINGO OOPHORECTOMY.  The postoperative course was uneventful.  She was discharged to home on postoperative day 1 tolerating a regular diet, ambulating, pain controlled, voiding, passing flatus.  Consults: None  Significant Diagnostic Studies: None  Treatments: surgery: see above  Discharge Exam: Blood pressure 116/67, pulse 63, temperature 98.2 F (36.8 C), temperature source Oral, resp. rate 16, height 5\' 3"  (1.6 m), weight 227 lb (103 kg), SpO2 94 %. General appearance: alert, cooperative and no distress Resp: clear to auscultation bilaterally Cardio: regular rate and rhythm, S1, S2 normal, no murmur, click, rub or gallop GI: soft, non-tender; bowel sounds normal; no masses,  no organomegaly Extremities: extremities normal, atraumatic, no cyanosis or edema Incision/Wound: lap sites to the abdomen with dermabond without erythema or drainage  Disposition: Discharge disposition: 01-Home or Self Care       Discharge Instructions    Call MD for:  difficulty breathing, headache or visual disturbances   Complete by:  As directed    Call MD for:  extreme fatigue   Complete by:  As directed    Call MD for:  hives   Complete by:  As directed    Call MD for:  persistant dizziness or light-headedness   Complete by:  As directed    Call MD for:  persistant nausea and vomiting   Complete by:  As directed    Call MD for:  redness, tenderness, or signs of infection (pain, swelling, redness, odor or green/yellow discharge around incision site)   Complete by:  As directed    Call MD for:  severe uncontrolled pain   Complete by:  As directed    Call MD for:  temperature >100.4   Complete by:  As directed    Diet - low sodium heart healthy   Complete by:  As directed    Driving Restrictions   Complete by:  As directed    No driving for 1 week.  Do not take narcotics and drive.   Increase activity slowly   Complete by:  As directed    Lifting restrictions   Complete by:  As directed    No lifting greater than 10 lbs.   Sexual Activity Restrictions   Complete by:  As directed    No sexual activity, nothing in the vagina, for 8 weeks.     Allergies as of 02/01/2018   No Known Allergies     Medication List    TAKE these medications   acetaminophen 325 MG tablet Commonly known as:  TYLENOL Take 650 mg by mouth every 4 (four) hours as needed for moderate pain or headache.   enalapril 10 MG tablet Commonly known as:  VASOTEC Take 1 tablet (10 mg total) by mouth daily.   hydrochlorothiazide 25 MG tablet Commonly known as:  HYDRODIURIL Take 1 tablet (25 mg total) by mouth daily.   nystatin powder Commonly known as:  MYCOSTATIN/NYSTOP Apply topically 2 (two) times daily. To abdominal skin fold  oxyCODONE 5 MG immediate release tablet Commonly known as:  Oxy IR/ROXICODONE Take 1 tablet (5 mg total) by mouth every 4 (four) hours as needed for severe pain or breakthrough pain.   oxymetazoline 0.05 % nasal spray Commonly known as:  AFRIN Place 1 spray into both nostrils daily as needed for congestion.      Follow-up Information    Everitt Amber, MD Follow up on 02/26/2018.   Specialty:  Obstetrics and Gynecology Why:  at 12:15 at the [facility] information: [place] [address] [phone]           Greater than thirty minutes were spend for  face to face discharge instructions and discharge orders/summary in EPIC.   Signed: Dorothyann Gibbs 02/01/2018, 10:47 AM

## 2018-02-01 NOTE — Plan of Care (Signed)
Patient met all care plan goals adequate for discharge

## 2018-02-01 NOTE — Discharge Instructions (Signed)
01/31/2018  Return to work: 4 weeks  Activity: 1. Be up and out of the bed during the day.  Take a nap if needed.  You may walk up steps but be careful and use the hand rail.  Stair climbing will tire you more than you think, you may need to stop part way and rest.   2. No lifting or straining for 6 weeks.  3. No driving for 1 weeks.  Do Not drive if you are taking narcotic pain medicine.  4. Shower daily.  Use soap and water on your incision and pat dry; don't rub.   5. No sexual activity and nothing in the vagina for 8 weeks.  Medications:  - Take ibuprofen and tylenol first line for pain control. Take these regularly (every 6 hours) to decrease the build up of pain.  - If necessary, for severe pain not relieved by ibuprofen, take percocet.  - While taking percocet you should take sennakot every night to reduce the likelihood of constipation. If this causes diarrhea, stop its use.  Diet: 1. Low sodium Heart Healthy Diet is recommended.  2. It is safe to use a laxative if you have difficulty moving your bowels.   Wound Care: 1. Keep clean and dry.  Shower daily.  Reasons to call the Doctor:   Fever - Oral temperature greater than 100.4 degrees Fahrenheit  Foul-smelling vaginal discharge  Difficulty urinating  Nausea and vomiting  Increased pain at the site of the incision that is unrelieved with pain medicine.  Difficulty breathing with or without chest pain  New calf pain especially if only on one side  Sudden, continuing increased vaginal bleeding with or without clots.   Follow-up: 1. See Everitt Amber in 4 weeks.  Contacts: For questions or concerns you should contact:  Dr. Everitt Amber at [phone] After hours and on week-ends call [phone] and ask to speak to the physician on call for Gynecologic Oncology  Oxycodone tablets or capsules What is this medicine? OXYCODONE (ox i KOE done) is a pain reliever. It is used to treat moderate to severe  pain. This medicine may be used for other purposes; ask your health care provider or pharmacist if you have questions. COMMON BRAND NAME(S): Dazidox, Endocodone, Oxaydo, OXECTA, OxyIR, Percolone, Roxicodone, ROXYBOND What should I tell my health care provider before I take this medicine? They need to know if you have any of these conditions: -Addison's disease -brain tumor -head injury -heart disease -history of drug or alcohol abuse problem -if you often drink alcohol -kidney disease -liver disease -lung or breathing disease, like asthma -mental illness -pancreatic disease -seizures -thyroid disease -an unusual or allergic reaction to oxycodone, codeine, hydrocodone, morphine, other medicines, foods, dyes, or preservatives -pregnant or trying to get pregnant -breast-feeding How should I use this medicine? Take this medicine by mouth with a glass of water. Follow the directions on the prescription label. You can take it with or without food. If it upsets your stomach, take it with food. Take your medicine at regular intervals. Do not take it more often than directed. Do not stop taking except on your doctor's advice. Some brands of this medicine, like Oxecta, have special instructions. Ask your doctor or pharmacist if these directions are for you: Do not cut, crush or chew this medicine. Swallow only one tablet at a time. Do not wet, soak, or lick the tablet before you take it. A special MedGuide will be given to you by the pharmacist  with each prescription and refill. Be sure to read this information carefully each time. Talk to your pediatrician regarding the use of this medicine in children. Special care may be needed. Overdosage: If you think you have taken too much of this medicine contact a poison control center or emergency room at once. NOTE: This medicine is only for you. Do not share this medicine with others. What if I miss a dose? If you miss a dose, take it as soon as you  can. If it is almost time for your next dose, take only that dose. Do not take double or extra doses. What may interact with this medicine? This medicine may interact with the following medications: -alcohol -antihistamines for allergy, cough and cold -antiviral medicines for HIV or AIDS -atropine -certain antibiotics like clarithromycin, erythromycin, linezolid, rifampin -certain medicines for anxiety or sleep -certain medicines for bladder problems like oxybutynin, tolterodine -certain medicines for depression like amitriptyline, fluoxetine, sertraline -certain medicines for fungal infections like ketoconazole, itraconazole, voriconazole -certain medicines for migraine headache like almotriptan, eletriptan, frovatriptan, naratriptan, rizatriptan, sumatriptan, zolmitriptan -certain medicines for nausea or vomiting like dolasetron, ondansetron, palonosetron -certain medicines for Parkinson's disease like benztropine, trihexyphenidyl -certain medicines for seizures like phenobarbital, phenytoin, primidone -certain medicines for stomach problems like dicyclomine, hyoscyamine -certain medicines for travel sickness like scopolamine -diuretics -general anesthetics like halothane, isoflurane, methoxyflurane, propofol -ipratropium -local anesthetics like lidocaine, pramoxine, tetracaine -MAOIs like Carbex, Eldepryl, Marplan, Nardil, and Parnate -medicines that relax muscles for surgery -methylene blue -nilotinib -other narcotic medicines for pain or cough -phenothiazines like chlorpromazine, mesoridazine, prochlorperazine, thioridazine This list may not describe all possible interactions. Give your health care provider a list of all the medicines, herbs, non-prescription drugs, or dietary supplements you use. Also tell them if you smoke, drink alcohol, or use illegal drugs. Some items may interact with your medicine. What should I watch for while using this medicine? Tell your doctor or health  care professional if your pain does not go away, if it gets worse, or if you have new or a different type of pain. You may develop tolerance to the medicine. Tolerance means that you will need a higher dose of the medicine for pain relief. Tolerance is normal and is expected if you take this medicine for a long time. Do not suddenly stop taking your medicine because you may develop a severe reaction. Your body becomes used to the medicine. This does NOT mean you are addicted. Addiction is a behavior related to getting and using a drug for a non-medical reason. If you have pain, you have a medical reason to take pain medicine. Your doctor will tell you how much medicine to take. If your doctor wants you to stop the medicine, the dose will be slowly lowered over time to avoid any side effects. There are different types of narcotic medicines (opiates). If you take more than one type at the same time or if you are taking another medicine that also causes drowsiness, you may have more side effects. Give your health care provider a list of all medicines you use. Your doctor will tell you how much medicine to take. Do not take more medicine than directed. Call emergency for help if you have problems breathing or unusual sleepiness. You may get drowsy or dizzy. Do not drive, use machinery, or do anything that needs mental alertness until you know how the medicine affects you. Do not stand or sit up quickly, especially if you are an older patient. This reduces  the risk of dizzy or fainting spells. Alcohol may interfere with the effect of this medicine. Avoid alcoholic drinks. This medicine will cause constipation. Try to have a bowel movement at least every 2 to 3 days. If you do not have a bowel movement for 3 days, call your doctor or health care professional. Your mouth may get dry. Chewing sugarless gum or sucking hard candy, and drinking plenty of water may help. Contact your doctor if the problem does not go away  or is severe. What side effects may I notice from receiving this medicine? Side effects that you should report to your doctor or health care professional as soon as possible: -allergic reactions like skin rash, itching or hives, swelling of the face, lips, or tongue -breathing problems -confusion -signs and symptoms of low blood pressure like dizziness; feeling faint or lightheaded, falls; unusually weak or tired -trouble passing urine or change in the amount of urine -trouble swallowing Side effects that usually do not require medical attention (report to your doctor or health care professional if they continue or are bothersome): -constipation -dry mouth -nausea, vomiting -tiredness This list may not describe all possible side effects. Call your doctor for medical advice about side effects. You may report side effects to FDA at 1-800-FDA-1088. Where should I keep my medicine? Keep out of the reach of children. This medicine can be abused. Keep your medicine in a safe place to protect it from theft. Do not share this medicine with anyone. Selling or giving away this medicine is dangerous and against the law. Store at room temperature between 15 and 30 degrees C (59 and 86 degrees F). Protect from light. Keep container tightly closed. This medicine may cause accidental overdose and death if it is taken by other adults, children, or pets. Flush any unused medicine down the toilet to reduce the chance of harm. Do not use the medicine after the expiration date. NOTE: This sheet is a summary. It may not cover all possible information. If you have questions about this medicine, talk to your doctor, pharmacist, or health care provider.  2018 Elsevier/Gold Standard (2015-08-10 16:55:57)

## 2018-02-05 ENCOUNTER — Telehealth: Payer: Self-pay

## 2018-02-05 NOTE — Telephone Encounter (Signed)
Told Ms Helmer that that the final surgical pathology showed not cancer per Melissa Cross,NP.  Ms Beahm is doing very well post operatively. No issues noted.

## 2018-02-26 ENCOUNTER — Inpatient Hospital Stay: Payer: No Typology Code available for payment source | Attending: Genetic Counselor | Admitting: Gynecologic Oncology

## 2018-02-26 ENCOUNTER — Encounter: Payer: Self-pay | Admitting: Gynecologic Oncology

## 2018-02-26 VITALS — BP 117/87 | HR 75 | Temp 98.2°F | Resp 20 | Ht 63.5 in | Wt 226.6 lb

## 2018-02-26 DIAGNOSIS — D252 Subserosal leiomyoma of uterus: Secondary | ICD-10-CM | POA: Diagnosis present

## 2018-02-26 DIAGNOSIS — Z90710 Acquired absence of both cervix and uterus: Secondary | ICD-10-CM | POA: Diagnosis not present

## 2018-02-26 DIAGNOSIS — Z90722 Acquired absence of ovaries, bilateral: Secondary | ICD-10-CM | POA: Insufficient documentation

## 2018-02-26 DIAGNOSIS — N858 Other specified noninflammatory disorders of uterus: Secondary | ICD-10-CM

## 2018-02-26 NOTE — Progress Notes (Signed)
H&P Note: Gyn-Onc  Consult was requested by Dr. Quincy Simmonds for the evaluation of Kimberly Wall 57 y.o. female  CC:  Chief Complaint  Patient presents with  . Uterine mass    Interval Hx:  On 01/31/18 she underwent robotic assisted total hysterectomy for uterus >250gm, BSO. Pathology confirmed a large subserosal fibroid (benign). Surgery was uncomplicated.  She has done well after surgery with minimal complaints. Her urinary urgency symptoms are somewhat improved.   HPI: Ms. Kimberly Wall  is a very nice 57 y.o.  P3  She has had a long history of pelvic relaxation and reports a long history of fibroids.  As long as 12 years ago she was noted in [place] by her gynecologist to be diagnosed with both of these conditions.  She has been followed here in [facility] by Dr. Juanda Chance with [facility] for approximately 12 years.  She has been getting intermittent ultrasound and pelvic exams to follow her "fibroids".  Her insurance changed approximately 1 year ago and her follow-up has been with Dr. Quincy Simmonds since that time.  The patient notes that her last menstrual period was March 2018 with the year prior to that having oligomenorrhea but on occasion bleeding as long as 3 weeks.  She did not go quite a whole year before bleeding again with the most recent bleed being February 2019 for 5 days which seemed to be normal period.  Dr. Quincy Simmonds checked an [facility] and that was 30.4 with an estradiol being 11.1 on 11/26/2017.  Given the question of postmenopausal bleeding and endometrial biopsy was performed and found to be benign December 13, 2017.  Pap smear November 26, 2017 showed no intraepithelial lesion or malignancy and negative high risk HPV.  Patient has a family history of breast cancer and had genetic testing recently completed with an 1 gene panel with the findings of no significant mutations.  Dr. Quincy Simmonds was considering a hysterectomy with repair for her significant uterovaginal prolapse including the possibility of  sacral colpopexy.  Given the history of the fibroid and the possibility of morcellation an MRI was obtained for reassurance.  Unfortunately the MRI dated 01/22/2018 describes the mass as being heterogeneous with internal enhancement not typical of a benign leiomyoma and alternatively the lesion may be arising from the right ovary.  Given this possibility the patient was referred for management.  Measurement of disease:  Recent Labs    01/29/18 1206  CAN125 6.4    . Pending further workup   Radiology: . 01/22/2018-MRI of the pelvis with and without contrast-[facility]-uterus is 7.1 x 3.9 x 4.7 with a normal trilaminar appearance of the endometrium.  A large well-circumscribed mass is identified along the right side of the uterine fundus measuring 13.9 x 5.8 x 6.2.  The mass is predominantly T2 hypointense and T1 hypointense with mild heterogeneous areas of internal enhancement following IV contrast.  The margins of this mass show a septated area of increased fluid signal intensity.  There appears to be a large broad-based attachment to the right side of the uterine fundus which measures 3.8 cm.  The right ovary appears to extend along the right side of the pelvic mass.  There is a normal left ovary.  Impression differential includes atypical appearance of a benign leiomyoma versus malignant lesions.  Overall the appearance is not typical of a benign leiomyoma.  Alternatively the lesion may be arising from the right ovary.    Oncologic History: Pending further workup    No history  exists.    Current Meds:  Outpatient Encounter Medications as of 02/26/2018  Medication Sig  . acetaminophen (TYLENOL) 325 MG tablet Take 650 mg by mouth every 4 (four) hours as needed for moderate pain or headache.  . enalapril (VASOTEC) 10 MG tablet Take 1 tablet (10 mg total) by mouth daily.  . hydrochlorothiazide (HYDRODIURIL) 25 MG tablet Take 1 tablet (25 mg total) by mouth daily.  Marland Kitchen nystatin  (MYCOSTATIN/NYSTOP) powder Apply topically 2 (two) times daily. To abdominal skin fold  . oxyCODONE (OXY IR/ROXICODONE) 5 MG immediate release tablet Take 1 tablet (5 mg total) by mouth every 4 (four) hours as needed for severe pain or breakthrough pain.  Marland Kitchen oxymetazoline (AFRIN) 0.05 % nasal spray Place 1 spray into both nostrils daily as needed for congestion.   No facility-administered encounter medications on file as of 02/26/2018.     Allergy: No Known Allergies  Social Hx:   Social History   Socioeconomic History  . Marital status: Married    Spouse name: Not on file  . Number of children: Not on file  . Years of education: Not on file  . Highest education level: Not on file  Occupational History  . Not on file  Social Needs  . Financial resource strain: Not on file  . Food insecurity:    Worry: Not on file    Inability: Not on file  . Transportation needs:    Medical: Not on file    Non-medical: Not on file  Tobacco Use  . Smoking status: Never Smoker  . Smokeless tobacco: Never Used  Substance and Sexual Activity  . Alcohol use: Yes    Comment: 1/month  . Drug use: No  . Sexual activity: Yes    Birth control/protection: Other-see comments    Comment: Husband had Vasectomy  Lifestyle  . Physical activity:    Days per week: Not on file    Minutes per session: Not on file  . Stress: Not on file  Relationships  . Social connections:    Talks on phone: Not on file    Gets together: Not on file    Attends religious service: Not on file    Active member of club or organization: Not on file    Attends meetings of clubs or organizations: Not on file    Relationship status: Not on file  . Intimate partner violence:    Fear of current or ex partner: Not on file    Emotionally abused: Not on file    Physically abused: Not on file    Forced sexual activity: Not on file  Other Topics Concern  . Not on file  Social History Narrative  . Not on file    Past Surgical  Hx:  Past Surgical History:  Procedure Laterality Date  . PILONIDAL CYST EXCISION  1987  . ROBOTIC ASSISTED TOTAL HYSTERECTOMY WITH BILATERAL SALPINGO OOPHERECTOMY Bilateral 01/31/2018   Procedure: XI ROBOTIC ASSISTED TOTAL HYSTERECTOMY (FOR UTERUS GREATER THAN  250 GRAMS)WITH BILATERAL SALPINGO OOPHORECTOMY;  Surgeon: Everitt Amber, MD;  Location: [facility];  Service: Gynecology;  Laterality: Bilateral;    Past Medical Hx:  Past Medical History:  Diagnosis Date  . Allergy    seasonal  . Arthritis    HANDS  . Complication of anesthesia   . Family history of cancer   . Hypertension   . Ovarian cyst    Sounds like this is a misinterpretation and she is referring to her fibroid  . PONV (  postoperative nausea and vomiting)     Past Gynecological History:   GYNECOLOGIC HISTORY:  No LMP recorded. Patient is perimenopausal. P 3 LMP 11/13/2017 Contraceptive greater than 10 years of OCPs; currently female partner has a vasectomy HRT none  Last Pap February 2019 normal and on the chart  Family Hx:  Family History  Problem Relation Age of Onset  . Arthritis Mother   . Cancer Mother        glioblastoma  . Varicose Veins Mother   . Arthritis Father   . Diabetes Father   . Hearing loss Father   . Heart disease Father   . Hypertension Father   . Vision loss Father   . Cancer Maternal Grandmother        throat  . Varicose Veins Maternal Grandmother   . Stroke Maternal Grandfather   . Stroke Paternal Grandfather   . Thyroid disease Sister   . Cancer Maternal Aunt        breast  . Kidney disease Paternal Grandmother   . Breast cancer Cousin        mat cousin, daughter of aunt with breast cancer, dx in her 67s  . Breast cancer Cousin        mat first cousin, daughter of aunt without cancer, dx in her 34s  . Colon cancer Cousin 29       mat cousin; son of mat uncle    Review of Systems:  Review of Systems  Constitutional: Negative.   HENT: Negative.   Eyes: Negative.    Respiratory: Negative.   Cardiovascular: Negative.   Gastrointestinal: Negative.   Genitourinary: Negative for urgency.  Musculoskeletal: Negative.   Skin: Negative.   Neurological: Negative.   Endo/Heme/Allergies: Negative.   Psychiatric/Behavioral: Negative.    Gyn - ? Postmenopausal bleeding  Vitals:  Blood pressure 117/87, pulse 75, temperature 98.2 F (36.8 C), temperature source Oral, resp. rate 20, height 5' 3.5" (1.613 m), weight 226 lb 9.6 oz (102.8 kg), SpO2 97 %. Body mass index is 39.51 kg/m.   Physical Exam: ECOG PERFORMANCE STATUS: 1 - Symptomatic but completely ambulatory   General :  Well developed, 57 y.o., female in no apparent distress HEENT:  Normocephalic/atraumatic, symmetric, EOMI, eyelids normal Neck:   Supple, no masses.  Lymphatics:  No cervical/ submandibular/ supraclavicular/ infraclavicular/ inguinal adenopathy Respiratory:  Respirations unlabored, no use of accessory muscles CV:   Deferred Breast:  Deferred Musculoskeletal: No CVA tenderness, normal muscle strength. Abdomen:  Overweight soft, non-tender and nondistended. No evidence of hernia. No masses. Well healed incisions.  Extremities:  No lymphedema, no erythema, non-tender. Skin:   Normal inspection Neuro/Psych:  No focal motor deficit, no abnormal mental status. Normal gait. Normal affect. Alert and oriented to person, place, and time  Genito Urinary: Vulva: Normal external female genitalia.  Bladder/urethra: Urethral meatus normal in size and location. No lesions or   masses,  + evidence of a cystocele and uterovaginal prolapse. Speculum: normal postop exam, surgically absent cervix and uterus. Intact cuff. No bleeding.  Adnexa: No masses. Rectovaginal:  deferred  Oncologic Summary: 1. Pelvic mass suspected fibroid possible adnexal mass  Assessment/Plan: Ms Mcculley has a history of a fibroid uterus, s/p robotic total hysterectomy, BSO on 01/31/18.  Recommend follow-up with Dr Quincy Simmonds  for ongoing urinary urgency symptoms and routine gynecologic care.    Thereasa Solo, MD  02/26/2018, 12:59 PM  Cc: Josefa Half, MD Dr. [facility] (PCP)

## 2018-02-26 NOTE — Patient Instructions (Signed)
Pathology from your surgery showed no cancer. There was a fibroid in the uterus.  Please follow-up with Dr Quincy Simmonds as needed with respect to your bladder symptoms. Contact Dr Denman George at [phone] with questions.

## 2018-03-05 ENCOUNTER — Ambulatory Visit
Admission: RE | Admit: 2018-03-05 | Discharge: 2018-03-05 | Disposition: A | Discharge status: Home / Self Care | Payer: No Typology Code available for payment source | Source: Ambulatory Visit | Attending: Obstetrics and Gynecology | Admitting: Obstetrics and Gynecology

## 2018-03-05 DIAGNOSIS — Z1231 Encounter for screening mammogram for malignant neoplasm of breast: Secondary | ICD-10-CM

## 2018-03-19 ENCOUNTER — Encounter: Payer: Self-pay | Admitting: Obstetrics and Gynecology

## 2018-03-20 MED FILL — ENALAPRIL MALEATE 10 MG TAB: 10 | 90 days supply | Qty: 90 | Fill #1

## 2018-05-09 MED FILL — HYDROCHLOROTHIAZIDE 25 MG T: 25 | 90 days supply | Qty: 90 | Fill #1

## 2018-05-16 ENCOUNTER — Ambulatory Visit (INDEPENDENT_AMBULATORY_CARE_PROVIDER_SITE_OTHER): Payer: No Typology Code available for payment source | Admitting: Physician Assistant

## 2018-05-16 ENCOUNTER — Encounter: Payer: Self-pay | Admitting: Physician Assistant

## 2018-05-16 VITALS — BP 118/80 | HR 73 | Temp 97.8°F | Resp 16 | Ht 63.5 in | Wt 233.6 lb

## 2018-05-16 DIAGNOSIS — Z23 Encounter for immunization: Secondary | ICD-10-CM

## 2018-05-16 DIAGNOSIS — T148XXA Other injury of unspecified body region, initial encounter: Secondary | ICD-10-CM | POA: Diagnosis not present

## 2018-05-16 NOTE — Progress Notes (Signed)
    Patient ID: Kimberly Wall MRN: [id number], DOB: [birth year], 57 y.o. Date of Encounter: 05/16/2018, 4:50 PM    Chief Complaint:  Chief Complaint  Patient presents with  . got stuck by end of drill tip     HPI: 57 y.o. year old female presents with above.   Reports that they have been doing some work at their house.  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 Has picture on her phone that she shows me-- picture of some of their equipment.   One of the pieces of equipment looks like a drill but what looks like the drill bit is extremely long.  States that this was all resting on the floor/ground.  She was walking by and the tip of this stuck in her leg. She realized that it has been a long time (greater than 10 years) since her last tetanus vaccine so felt she should come get tetanus vaccine. States that this injury just happened in the past 2 hours.  Has had no signs of infection.  No purulent drainage.  No erythema.  No fevers or chills.  No other concerns to address.     Home Meds:   Outpatient Medications Prior to Visit  Medication Sig Dispense Refill  . acetaminophen (TYLENOL) 325 MG tablet Take 650 mg by mouth every 4 (four) hours as needed for moderate pain or headache.    . enalapril (VASOTEC) 10 MG tablet Take 1 tablet (10 mg total) by mouth daily. 90 tablet 2  . hydrochlorothiazide (HYDRODIURIL) 25 MG tablet Take 1 tablet (25 mg total) by mouth daily. 90 tablet 2  . nystatin (MYCOSTATIN/NYSTOP) powder Apply topically 2 (two) times daily. To abdominal skin fold 15 g 0  . oxyCODONE (OXY IR/ROXICODONE) 5 MG immediate release tablet Take 1 tablet (5 mg total) by mouth every 4 (four) hours as needed for severe pain or breakthrough pain. 30 tablet 0  . oxymetazoline (AFRIN) 0.05 % nasal spray Place 1 spray into both nostrils daily as needed for congestion.     No facility-administered medications prior to visit.     Allergies: No Known Allergies    Review of  Systems: See HPI for pertinent ROS. All other ROS negative.    Physical Exam: Blood pressure 118/80, pulse 73, temperature 97.8 F (36.6 C), temperature source Oral, resp. rate 16, height 5' 3.5" (1.613 m), weight 106 kg, SpO2 97 %., Body mass index is 40.73 kg/m. General:  WF. Appears in no acute distress. Neck: Supple. No thyromegaly. No lymphadenopathy. Lungs: Clear bilaterally to auscultation without wheezes, rales, or rhonchi. Breathing is unlabored. Heart: Regular rhythm. No murmurs, rubs, or gallops. Msk:  Strength and tone normal for age. Extremities/Skin: Left lower leg--- Anterolateral aspect----there is 59mm diameter wound. 83mm depth.  No drainage.  There is no surrounding erythema. Neuro: Alert and oriented X 3. Moves all extremities spontaneously. Gait is normal. CNII-XII grossly in tact. Psych:  Responds to questions appropriately with a normal affect.     ASSESSMENT AND PLAN:  57 y.o. year old female with   1. Puncture wound Update tetanus vaccine.  Give Tdap now. No other treatment necessary.  She will watch for signs of infection and will follow-up if needed.   [address] [place], [place], [facility] 05/16/2018 4:50 PM

## 2018-06-28 MED FILL — ENALAPRIL MALEATE 10 MG TAB: 10 | 90 days supply | Qty: 90 | Fill #2

## 2018-08-22 ENCOUNTER — Ambulatory Visit: Payer: No Typology Code available for payment source | Admitting: Cardiology

## 2018-10-17 MED FILL — HYDROCHLOROTHIAZIDE 25 MG T: 25 | 90 days supply | Qty: 90 | Fill #0

## 2018-10-17 MED FILL — ENALAPRIL MALEATE 10 MG TAB: 10 | 90 days supply | Qty: 90 | Fill #0

## 2018-11-07 ENCOUNTER — Ambulatory Visit (INDEPENDENT_AMBULATORY_CARE_PROVIDER_SITE_OTHER): Payer: No Typology Code available for payment source | Admitting: Family Medicine

## 2018-11-07 ENCOUNTER — Encounter: Payer: Self-pay | Admitting: Family Medicine

## 2018-11-07 VITALS — BP 130/82 | HR 100 | Temp 99.0°F | Resp 16 | Ht 62.0 in | Wt 235.0 lb

## 2018-11-07 DIAGNOSIS — R6889 Other general symptoms and signs: Secondary | ICD-10-CM

## 2018-11-07 LAB — INFLUENZA A AND B AG, IMMUNOASSAY
INFLUENZA A ANTIGEN: NOT DETECTED
INFLUENZA B ANTIGEN: NOT DETECTED

## 2018-11-07 MED ORDER — OSELTAMIVIR PHOSPHATE 75 MG PO CAPS: 75.0000 mg | ORAL_CAPSULE | Freq: Two times a day (BID) | ORAL | 0 refills | Status: AC

## 2018-11-07 MED ORDER — PREDNISONE 20 MG PO TABS: 40.0000 mg | ORAL_TABLET | Freq: Every day | ORAL | 0 refills | Status: AC

## 2018-11-07 MED ORDER — BENZONATATE 100 MG PO CAPS: 100.0000 mg | ORAL_CAPSULE | Freq: Three times a day (TID) | ORAL | 0 refills | Status: DC | PRN

## 2018-11-07 MED ORDER — HYDROCODONE-HOMATROPINE 5-1.5 MG/5ML PO SYRP: 5.0000 mL | ORAL_SOLUTION | Freq: Three times a day (TID) | ORAL | 0 refills | Status: DC | PRN

## 2018-11-07 MED ORDER — GUAIFENESIN ER 600 MG PO TB12: 600.0000 mg | ORAL_TABLET | Freq: Two times a day (BID) | ORAL | 0 refills | Status: AC

## 2018-11-07 MED ORDER — ALBUTEROL SULFATE 108 (90 BASE) MCG/ACT IN AEPB: 2.0000 | INHALATION_SPRAY | RESPIRATORY_TRACT | 1 refills | Status: DC | PRN

## 2018-11-07 MED FILL — HYDROCODONE-HOMATROPINE SOL: 5-1.5 | 8 days supply | Qty: 120 | Fill #0

## 2018-11-07 MED FILL — BENZONATATE 100 MG CAPS: 100 | 10 days supply | Qty: 30 | Fill #0

## 2018-11-07 MED FILL — OSELTAMIVIR PHOSPHATE 75 MG: 75 | 5 days supply | Qty: 10 | Fill #0

## 2018-11-07 MED FILL — predniSONE 20 MG TABS: 20 | 5 days supply | Qty: 10 | Fill #0

## 2018-11-07 NOTE — Progress Notes (Signed)
Patient ID: Kimberly Wall, female    DOB: [birth year], 58 y.o.   MRN: [id number]  PCP: Alycia Rossetti, MD  Chief Complaint  Patient presents with  . Fever    Patient in with c/o cough, fever of 101.4, headache,muscle aches. Onset yesterday.     Subjective:   Kimberly Wall is a 58 y.o. female, presents to clinic with CC of 2 days of head congestion and cough, came on fairly fast two days ago, started with a mild sore throat and quickly developed generalized malaise, body aches, fever to 101.4 with HA.   She has + sick contacts, she has been in the hospital and nursing home all week, her ,other in law admitted to hospital with pneumonia 4 days ago,  They were in the ER with her Monday for over 12 hours - mom got discharged to facility  Pt's cough feels a little cough, mostly non-productive, some wheeze and SOB.  No CP. No smoking hx, no hx of asthma.      HPI    Patient Active Problem List   Diagnosis Date Noted  . Pelvic mass 01/23/2018  . Genetic testing 01/21/2018  . Family history of breast cancer   . Family history of colon cancer   . Family history of brain cancer   . Subserous leiomyoma of uterus 01/09/2018  . Fibroid 12/13/2017  . Complete uterovaginal prolapse 12/13/2017  . Essential hypertension 10/15/2017  . Bilateral lower extremity edema 10/15/2017     Prior to Admission medications   Medication Sig Start Date End Date Taking? Authorizing Provider  acetaminophen (TYLENOL) 325 MG tablet Take 650 mg by mouth every 4 (four) hours as needed for moderate pain or headache.   Yes [provider]  enalapril (VASOTEC) 10 MG tablet Take 1 tablet (10 mg total) by mouth daily. 10/15/17  Yes Orlena Sheldon, PA-C  hydrochlorothiazide (HYDRODIURIL) 25 MG tablet Take 1 tablet (25 mg total) by mouth daily. 10/15/17  Yes Dena Billet B, PA-C     No Known Allergies   Family History  Problem Relation Age of Onset  . Arthritis Mother   . Cancer Mother    glioblastoma  . Varicose Veins Mother   . Arthritis Father   . Diabetes Father   . Hearing loss Father   . Heart disease Father   . Hypertension Father   . Vision loss Father   . Cancer Maternal Grandmother        throat  . Varicose Veins Maternal Grandmother   . Stroke Maternal Grandfather   . Stroke Paternal Grandfather   . Thyroid disease Sister   . Cancer Maternal Aunt        breast  . Kidney disease Paternal Grandmother   . Breast cancer Cousin        mat cousin, daughter of aunt with breast cancer, dx in her 32s  . Breast cancer Cousin        mat first cousin, daughter of aunt without cancer, dx in her 3s  . Colon cancer Cousin 38       mat cousin; son of mat uncle     Social History   Socioeconomic History  . Marital status: Married    Spouse name: Not on file  . Number of children: Not on file  . Years of education: Not on file  . Highest education level: Not on file  Occupational History  . Not on file  Social Needs  .  Financial resource strain: Not on file  . Food insecurity:    Worry: Not on file    Inability: Not on file  . Transportation needs:    Medical: Not on file    Non-medical: Not on file  Tobacco Use  . Smoking status: Never Smoker  . Smokeless tobacco: Never Used  Substance and Sexual Activity  . Alcohol use: Yes    Comment: 1/month  . Drug use: No  . Sexual activity: Yes    Birth control/protection: Other-see comments    Comment: Husband had Vasectomy  Lifestyle  . Physical activity:    Days per week: Not on file    Minutes per session: Not on file  . Stress: Not on file  Relationships  . Social connections:    Talks on phone: Not on file    Gets together: Not on file    Attends religious service: Not on file    Active member of club or organization: Not on file    Attends meetings of clubs or organizations: Not on file    Relationship status: Not on file  . Intimate partner violence:    Fear of current or ex partner: Not on  file    Emotionally abused: Not on file    Physically abused: Not on file    Forced sexual activity: Not on file  Other Topics Concern  . Not on file  Social History Narrative  . Not on file     Review of Systems  Constitutional: Positive for activity change, chills, diaphoresis, fatigue and fever. Negative for unexpected weight change.  HENT: Negative.   Eyes: Negative.   Respiratory: Negative.   Cardiovascular: Negative.  Negative for chest pain, palpitations and leg swelling.  Gastrointestinal: Negative.  Negative for abdominal pain.  Endocrine: Negative.   Genitourinary: Negative.   Musculoskeletal: Negative.  Negative for back pain.  Skin: Negative.  Negative for color change.  Allergic/Immunologic: Negative.   Neurological: Negative.  Negative for dizziness and syncope.  Hematological: Negative.   Psychiatric/Behavioral: Negative.   All other systems reviewed and are negative.      Objective:    Vitals:   11/07/18 1216  BP: 130/82  Pulse: 100  Resp: 16  Temp: 99 F (37.2 C)  TempSrc: Oral  SpO2: 98%  Weight: 235 lb (106.6 kg)  Height: 5\' 2"  (1.575 m)      Physical Exam Vitals signs and nursing note reviewed.  Constitutional:      General: She is not in acute distress.    Appearance: She is well-developed. She is not ill-appearing, toxic-appearing or diaphoretic.  HENT:     Head: Normocephalic and atraumatic.     Right Ear: Hearing, tympanic membrane, ear canal and external ear normal.     Left Ear: Hearing, tympanic membrane, ear canal and external ear normal.     Nose: Mucosal edema and rhinorrhea present.     Right Sinus: No maxillary sinus tenderness or frontal sinus tenderness.     Left Sinus: No maxillary sinus tenderness or frontal sinus tenderness.     Mouth/Throat:     Mouth: Mucous membranes are not pale.     Pharynx: Uvula midline. No oropharyngeal exudate or uvula swelling.     Tonsils: No tonsillar abscesses.  Eyes:     General:         Right eye: No discharge.        Left eye: No discharge.     Conjunctiva/sclera: Conjunctivae normal.  Pupils: Pupils are equal, round, and reactive to light.  Neck:     Musculoskeletal: Normal range of motion and neck supple.     Trachea: No tracheal deviation.  Cardiovascular:     Rate and Rhythm: Normal rate and regular rhythm.     Heart sounds: Normal heart sounds.  Pulmonary:     Effort: Pulmonary effort is normal. No respiratory distress.     Breath sounds: No stridor. No wheezing, rhonchi or rales.  Abdominal:     General: Bowel sounds are normal. There is no distension.     Palpations: Abdomen is soft.  Musculoskeletal: Normal range of motion.  Skin:    General: Skin is warm and dry.     Coloration: Skin is not pale.     Findings: No rash.  Neurological:     Mental Status: She is alert.     Motor: No abnormal muscle tone.     Coordination: Coordination normal.  Psychiatric:        Behavior: Behavior normal.            Assessment & Plan:      ICD-10-CM   1. Flu-like symptoms R68.89 Influenza A and B Ag, Immunoassay    Pt with URI sx, acute onset of body aches, fever, malaise, cough with some wheeze/SOB, has been in hospitals and nursing homes with Ehrhardt who had pneumonia and now is in a nursing facility - she is concerned about possible flu - sx are consistent with possible influenza.  Will cover with tamiflu - fever onset was <48 hours ago, tx respiratory complaints with steroids, inhaler, mucinex, and OTC cough meds.  F/up if not improving starting to improve in 5-7 days.  Discussed tx for flu - benefit and SE of tamiflu - since she has no serious comorbidities she does not have to take tamiflu, but it may help and she is in the window of effectiveness.     Delsa Grana, PA-C 11/07/18 12:30 PM

## 2018-12-25 NOTE — Progress Notes (Signed)
58 y.o. G1P0003 Married Caucasian female here for annual exam.    Had robotic hysterectomy with BSO with Dr. Denman George in April 2019.  Notices emptying bladder more frequently. No urinary leakage with cough, laugh, or sneeze.  Can leak urine if waits too long.  No pad use.  Good bowel function. Notes stool at times in her underwear.  Good sexual function.   Some hot flashes.  They are manageable.  PCP: Dr. Vic Blackbird     No LMP recorded. Patient is perimenopausal.           Sexually active: Yes.    The current method of family planning is vasectomy.    Exercising: Yes.    zumba and walking Smoker:  no  Health Maintenance: Pap:  11/26/17 pap and HR HPV negative History of abnormal Pap:  No.  Cervical pathology at the time of hysterectomy was benign.  MMG:  03/05/18 BIRADS 1 negative/density b Colonoscopy:  2014 - normal per patient polyps removed.    BMD:   n/a  Result  n/a TDaP:  05/16/18 Gardasil:   n/a HIV and Hep C: donated blood in the past Screening Labs:  PCP   reports that she has never smoked. She has never used smokeless tobacco. She reports current alcohol use. She reports that she does not use drugs.  Past Medical History:  Diagnosis Date  . Allergy    seasonal  . Arthritis    HANDS  . Complication of anesthesia   . Family history of cancer   . Hypertension   . Ovarian cyst    Sounds like this is a misinterpretation and she is referring to her fibroid  . PONV (postoperative nausea and vomiting)     Past Surgical History:  Procedure Laterality Date  . PILONIDAL CYST EXCISION  1987  . ROBOTIC ASSISTED TOTAL HYSTERECTOMY WITH BILATERAL SALPINGO OOPHERECTOMY Bilateral 01/31/2018   Procedure: XI ROBOTIC ASSISTED TOTAL HYSTERECTOMY (FOR UTERUS GREATER THAN  250 GRAMS)WITH BILATERAL SALPINGO OOPHORECTOMY;  Surgeon: Everitt Amber, MD;  Location: [facility];  Service: Gynecology;  Laterality: Bilateral;    Current Outpatient Medications  Medication Sig Dispense  Refill  . acetaminophen (TYLENOL) 325 MG tablet Take 650 mg by mouth every 4 (four) hours as needed for moderate pain or headache.    . enalapril (VASOTEC) 10 MG tablet Take 1 tablet (10 mg total) by mouth daily. 90 tablet 2  . hydrochlorothiazide (HYDRODIURIL) 25 MG tablet Take 25 mg by mouth daily.     No current facility-administered medications for this visit.     Family History  Problem Relation Age of Onset  . Arthritis Mother   . Cancer Mother        glioblastoma  . Varicose Veins Mother   . Arthritis Father   . Diabetes Father   . Hearing loss Father   . Heart disease Father   . Hypertension Father   . Vision loss Father   . Cancer Maternal Grandmother        throat  . Varicose Veins Maternal Grandmother   . Stroke Maternal Grandfather   . Stroke Paternal Grandfather   . Thyroid disease Sister   . Cancer Maternal Aunt        breast  . Kidney disease Paternal Grandmother   . Breast cancer Cousin        mat cousin, daughter of aunt with breast cancer, dx in her 56s  . Breast cancer Cousin        mat  first cousin, daughter of aunt without cancer, dx in her 32s  . Colon cancer Cousin 75       mat cousin; son of mat uncle    Review of Systems  Constitutional: Negative.   HENT: Negative.   Eyes: Negative.   Respiratory: Negative.   Cardiovascular: Negative.   Gastrointestinal: Negative.   Endocrine: Negative.   Genitourinary: Negative.   Musculoskeletal: Negative.   Skin: Negative.   Allergic/Immunologic: Negative.   Neurological: Negative.   Hematological: Negative.   Psychiatric/Behavioral: Negative.     Exam:   BP 124/78 (BP Location: Right Arm, Patient Position: Sitting, Cuff Size: Large)   Pulse 72   Resp 12   Ht 5' 2.5" (1.588 m)   Wt 235 lb (106.6 kg)   BMI 42.30 kg/m     General appearance: alert, cooperative and appears stated age Head: Normocephalic, without obvious abnormality, atraumatic Neck: no adenopathy, supple, symmetrical, trachea  midline and thyroid normal to inspection and palpation Lungs: clear to auscultation bilaterally Breasts: normal appearance, no masses or tenderness, No nipple retraction or dimpling, No nipple discharge or bleeding, No axillary or supraclavicular adenopathy Heart: regular rate and rhythm Abdomen: soft, non-tender; no masses, no organomegaly Extremities: extremities normal, atraumatic, no cyanosis or edema Skin: Skin color, texture, turgor normal. No rashes or lesions Lymph nodes: Cervical, supraclavicular, and axillary nodes normal. No abnormal inguinal nodes palpated Neurologic: Grossly normal  Pelvic: External genitalia:  no lesions              Urethra:  normal appearing urethra with no masses, tenderness or lesions              Bartholins and Skenes: normal                 Vagina: normal appearing vagina with normal color and discharge, no lesions.  Second degree rectocele. Good anterior support and apical support.               Cervix: absent.              Pap taken: No. Bimanual Exam:  Uterus:   absent              Adnexa: no mass, fullness, tenderness              Rectal exam: Yes.  .  Confirms.              Anus:  normal sphincter tone, no lesions  Chaperone was present for exam.  Assessment:   Well woman visit with normal exam. Status post robotic TLH/BSO with DR. Denman George.  Fibroids. Rectocele.  Fecal incontinence.   Plan: Mammogram screening. Recommended self breast awareness. Pap and HR HPV as above. Guidelines for Calcium, Vitamin D, regular exercise program including cardiovascular and weight bearing exercise. We discussed her fecal incontinence and rectocele - risk factors, signs and symptoms, and possible treatment options - observation, pessary, surgical repair.  She will start by taking Metamucil.  Follow up annually and prn.    After visit summary provided.

## 2018-12-26 ENCOUNTER — Other Ambulatory Visit: Payer: Self-pay

## 2018-12-26 ENCOUNTER — Ambulatory Visit: Payer: No Typology Code available for payment source | Admitting: Obstetrics and Gynecology

## 2018-12-26 ENCOUNTER — Encounter: Payer: Self-pay | Admitting: Obstetrics and Gynecology

## 2018-12-26 ENCOUNTER — Ambulatory Visit (INDEPENDENT_AMBULATORY_CARE_PROVIDER_SITE_OTHER): Payer: No Typology Code available for payment source | Admitting: Obstetrics and Gynecology

## 2018-12-26 VITALS — BP 124/78 | HR 72 | Resp 12 | Ht 62.5 in | Wt 235.0 lb

## 2018-12-26 DIAGNOSIS — R159 Full incontinence of feces: Secondary | ICD-10-CM

## 2018-12-26 DIAGNOSIS — Z01419 Encounter for gynecological examination (general) (routine) without abnormal findings: Secondary | ICD-10-CM

## 2018-12-26 DIAGNOSIS — N816 Rectocele: Secondary | ICD-10-CM

## 2018-12-26 NOTE — Patient Instructions (Signed)
EXERCISE AND DIET:  We recommended that you start or continue a regular exercise program for good health. Regular exercise means any activity that makes your heart beat faster and makes you sweat.  We recommend exercising at least 30 minutes per day at least 3 days a week, preferably 4 or 5.  We also recommend a diet low in fat and sugar.  Inactivity, poor dietary choices and obesity can cause diabetes, heart attack, stroke, and kidney damage, among others.    ALCOHOL AND SMOKING:  Women should limit their alcohol intake to no more than 7 drinks/beers/glasses of wine (combined, not each!) per week. Moderation of alcohol intake to this level decreases your risk of breast cancer and liver damage. And of course, no recreational drugs are part of a healthy lifestyle.  And absolutely no smoking or even second hand smoke. Most people know smoking can cause heart and lung diseases, but did you know it also contributes to weakening of your bones? Aging of your skin?  Yellowing of your teeth and nails?  CALCIUM AND VITAMIN D:  Adequate intake of calcium and Vitamin D are recommended.  The recommendations for exact amounts of these supplements seem to change often, but generally speaking 600 mg of calcium (either carbonate or citrate) and 800 units of Vitamin D per day seems prudent. Certain women may benefit from higher intake of Vitamin D.  If you are among these women, your doctor will have told you during your visit.    PAP SMEARS:  Pap smears, to check for cervical cancer or precancers,  have traditionally been done yearly, although recent scientific advances have shown that most women can have pap smears less often.  However, every woman still should have a physical exam from her gynecologist every year. It will include a breast check, inspection of the vulva and vagina to check for abnormal growths or skin changes, a visual exam of the cervix, and then an exam to evaluate the size and shape of the uterus and  ovaries.  And after 56 years of age, a rectal exam is indicated to check for rectal cancers. We will also provide age appropriate advice regarding health maintenance, like when you should have certain vaccines, screening for sexually transmitted diseases, bone density testing, colonoscopy, mammograms, etc.   MAMMOGRAMS:  All women over 40 years old should have a yearly mammogram. Many facilities now offer a "3D" mammogram, which may cost around $50 extra out of pocket. If possible,  we recommend you accept the option to have the 3D mammogram performed.  It both reduces the number of women who will be called back for extra views which then turn out to be normal, and it is better than the routine mammogram at detecting truly abnormal areas.    COLONOSCOPY:  Colonoscopy to screen for colon cancer is recommended for all women at age 50.  We know, you hate the idea of the prep.  We agree, BUT, having colon cancer and not knowing it is worse!!  Colon cancer so often starts as a polyp that can be seen and removed at colonscopy, which can quite literally save your life!  And if your first colonoscopy is normal and you have no family history of colon cancer, most women don't have to have it again for 10 years.  Once every ten years, you can do something that may end up saving your life, right?  We will be happy to help you get it scheduled when you are ready.    Be sure to check your insurance coverage so you understand how much it will cost.  It may be covered as a preventative service at no cost, but you should check your particular policy.   ? ?About Rectocele ? ?Overview ? ?A rectocele is a type of hernia which causes different degrees of bulging of the rectal tissues into the vaginal wall.  You may even notice that it presses against the vaginal wall so much that some vaginal tissues droop outside of the opening of your vagina. ? ?Causes of Rectocele ? ?The most common cause is childbirth.  The muscles and ligaments  in the pelvis that hold up and support the female organs and vagina become stretched and weakened during labor and delivery.  The more babies you have, the more the support tissues are stretched and weakened.  Not everyone who has a baby will develop a rectocele.  Some women have stronger supporting tissue in the pelvis and may not have as much of a problem as others.  Women who have a Cesarean section usually do not get rectocele's unless they pushed a long time prior to the cesarean delivery. ? ?Other conditions that can cause a rectocele include chronic constipation, a chronic cough, a lot of heavy lifting, and obesity.  Older women may have this problem because the loss of female hormones causes the vaginal tissue to become weaker. ? ?Symptoms ? ?There may not be any symptoms.  If you do have symptoms, they may include: ?Pelvic pressure in the rectal area ?Protrusion of the lower part of the vagina through the opening of the vagina ?Constipation and trapping of the stool, making it difficult to have a bowel movement.  In severe cases, you may have to press on the lower part of your vagina to help push the stool out of you rectum.  This is called splinting to empty. ? ?Diagnosing Rectocele ? ?Your health care provider will ask about your symptoms and perform a pelvic exam.  S/he will ask you to bear down, pushing like you are having a bowel movement so as to see how far the lower part of the vagina protrudes into the vagina and possible outside of the vagina.  Your provider will also ask you to contract the muscles of your pelvis (like you are stopping the stream in the middle of urinating) to determine the strength of your pelvic muscles.  Your provider may also do a rectal exam. ? ?Treatment Options ? ?If you do not have any symptoms, no treatment may be necessary.  Other treatment options include: ?Pelvic floor exercises: Contracting the muscles in your genital area may help strengthen your muscles and support  the organs.  Be sure to get proper exercise instruction from you physical therapist. ?A pessary (removealbe pelvic support device) sometimes helps rectocele symptoms. ?Surgery: Surgical repair may be necessary. In some cases the uterus may need to be taken out ( a hysterectomy) as well.  There are many types of surgery for pelvic support problems.  Look for physicians who specialize in repair procedures. ? ?You can take care of yourself by: ?Treating and preventing constipation ?Avoiding heavy lifting, and lifting correctly (with your legs, not with you waist or back) ?Treating a chronic cough or bronchitis ?Not smoking ?avoiding too much weight gain ?Doing pelvic floor exercises ? ?? 2007, [employer] Doc.33 ?

## 2019-01-17 MED FILL — ENALAPRIL MALEATE 10 MG TAB: 10 | 90 days supply | Qty: 90 | Fill #0

## 2019-01-17 MED FILL — HYDROCHLOROTHIAZIDE 25 MG T: 25 | 90 days supply | Qty: 90 | Fill #0

## 2019-04-08 ENCOUNTER — Other Ambulatory Visit: Payer: Self-pay | Admitting: *Deleted

## 2019-04-08 MED ORDER — SHINGRIX 50 MCG/0.5ML IM SUSR: 0.5000 mL | Freq: Once | INTRAMUSCULAR | 1 refills | Status: AC

## 2019-04-22 ENCOUNTER — Other Ambulatory Visit: Payer: No Typology Code available for payment source

## 2019-04-22 ENCOUNTER — Other Ambulatory Visit: Payer: Self-pay

## 2019-04-22 DIAGNOSIS — Z20822 Contact with and (suspected) exposure to covid-19: Secondary | ICD-10-CM

## 2019-04-27 LAB — NOVEL CORONAVIRUS, NAA: SARS-CoV-2, NAA: NOT DETECTED

## 2019-04-28 MED FILL — ENALAPRIL MALEATE 10 MG TAB: 10 | 90 days supply | Qty: 90 | Fill #0

## 2019-05-01 ENCOUNTER — Other Ambulatory Visit: Payer: Self-pay | Admitting: Obstetrics and Gynecology

## 2019-05-01 DIAGNOSIS — Z1231 Encounter for screening mammogram for malignant neoplasm of breast: Secondary | ICD-10-CM

## 2019-05-07 MED FILL — SHINGRIX 50 MCG SUS: 50 | 1 days supply | Qty: 1 | Fill #0

## 2019-06-13 ENCOUNTER — Ambulatory Visit
Admission: RE | Admit: 2019-06-13 | Discharge: 2019-06-13 | Disposition: A | Discharge status: Home / Self Care | Payer: No Typology Code available for payment source | Source: Ambulatory Visit | Attending: Obstetrics and Gynecology | Admitting: Obstetrics and Gynecology

## 2019-06-13 ENCOUNTER — Other Ambulatory Visit: Payer: Self-pay

## 2019-06-13 DIAGNOSIS — Z1231 Encounter for screening mammogram for malignant neoplasm of breast: Secondary | ICD-10-CM

## 2019-07-17 ENCOUNTER — Other Ambulatory Visit: Payer: Self-pay

## 2019-07-17 ENCOUNTER — Ambulatory Visit (INDEPENDENT_AMBULATORY_CARE_PROVIDER_SITE_OTHER): Payer: No Typology Code available for payment source

## 2019-07-17 DIAGNOSIS — Z23 Encounter for immunization: Secondary | ICD-10-CM

## 2019-07-23 ENCOUNTER — Other Ambulatory Visit: Payer: Self-pay | Admitting: Family Medicine

## 2019-07-23 DIAGNOSIS — I10 Essential (primary) hypertension: Secondary | ICD-10-CM

## 2019-07-23 MED FILL — ENALAPRIL MALEATE 10 MG TAB: 10 | 90 days supply | Qty: 90 | Fill #0

## 2019-07-23 MED FILL — HYDROCHLOROTHIAZIDE 25 MG T: 25 | 90 days supply | Qty: 90 | Fill #0

## 2019-07-25 MED FILL — SHINGRIX 50 MCG SUS: 50 | 1 days supply | Qty: 1 | Fill #1

## 2019-08-12 ENCOUNTER — Ambulatory Visit: Payer: No Typology Code available for payment source | Admitting: Family Medicine

## 2019-11-05 ENCOUNTER — Other Ambulatory Visit: Payer: Self-pay | Admitting: Family Medicine

## 2019-11-05 DIAGNOSIS — I10 Essential (primary) hypertension: Secondary | ICD-10-CM

## 2019-11-05 MED FILL — ENALAPRIL MALEATE 10 MG TAB: 10 | 30 days supply | Qty: 30 | Fill #0

## 2019-11-13 ENCOUNTER — Telehealth: Payer: Self-pay | Admitting: Obstetrics and Gynecology

## 2019-11-13 NOTE — Telephone Encounter (Signed)
Left message on voicemail to call and reschedule cancelled appointment. °

## 2019-11-18 ENCOUNTER — Telehealth: Payer: No Typology Code available for payment source | Admitting: Physician Assistant

## 2019-11-18 DIAGNOSIS — Z20822 Contact with and (suspected) exposure to covid-19: Secondary | ICD-10-CM

## 2019-11-18 MED ORDER — BENZONATATE 100 MG PO CAPS: 100.0000 mg | ORAL_CAPSULE | Freq: Three times a day (TID) | ORAL | 0 refills | Status: DC | PRN

## 2019-11-18 MED FILL — BENZONATATE 100 MG CAPS: 100 | 7 days supply | Qty: 20 | Fill #0

## 2019-11-18 NOTE — Progress Notes (Signed)
E-Visit for Corona Virus Screening  Your current symptoms could be consistent with the coronavirus.  Many health care providers can now test patients at their office but not all are.  [place] has multiple testing sites. For information on our [place] testing locations and hours go to [email or web address]  We are enrolling you in our [place] for [place] . Daily you will receive a questionnaire within the [place] website. Our COVID 19 response team will be monitoring your responses daily.  Testing Information: The COVID-19 Community Testing sites will begin testing BY APPOINTMENT ONLY.  You can schedule online at [email or web address]  If you do not have access to a smart phone or computer you may call [phone] for an appointment.   Additional testing sites in the Community:  . For [facility] Testing sites in [facility]  FaceUpdate.[email or web address]  . For Pop-up testing sites in [place]  BowlDirectory.co.uk  . For Testing sites with regular hours [email or web address]  . For [place] [email or web address]  . For [facility] [email or web address]  . For [facility] testing in [place] and [employer] [email or web address]  . For [facility] testing in [facility]   [email or web address]  For  more information about community testing call [phone]   Please quarantine yourself while awaiting your test results. Please stay home for a minimum of 10 days from the first day of illness with improving symptoms and you have had 24 hours of no fever (without the use of Tylenol (Acetaminophen)  Motrin (Ibuprofen) or any fever reducing medication).  Also - Do not get tested prior to returning to work because once you have had a positive test the test can stay positive for more then a month in some cases.   You should wear a mask or cloth face covering over your nose and mouth if you must be around other people or animals, including pets (even at home). Try to stay at least 6 feet away from other people. This will protect the people around you.  Please continue good preventive care measures, including:  frequent hand-washing, avoid touching your face, cover coughs/sneezes, stay out of crowds and keep a 6 foot distance from others.  COVID-19 is a respiratory illness with symptoms that are similar to the flu. Symptoms are typically mild to moderate, but there have been cases of severe illness and death due to the virus.   The following symptoms may appear 2-14 days after exposure: . Fever . Cough . Shortness of breath or difficulty breathing . Chills . Repeated shaking with chills . Muscle pain . Headache . Sore throat . New loss of taste or smell . Fatigue . Congestion or runny nose . Nausea or vomiting . Diarrhea  Go to the nearest hospital ED for assessment if fever/cough/breathlessness are severe or illness seems like a threat to life.  It is vitally important that if you feel that you have an infection such as this virus or any other virus that you stay home and away from places where you may spread it to others.  You should avoid contact with people age 40 and older.   You can use medication such as A prescription cough medication called Tessalon Perles 100 mg. You may take 1-2 capsules every 8 hours as needed for cough  You may also take acetaminophen (Tylenol) as needed for fever.  Reduce your risk of any infection by using the same precautions used for avoiding the common cold or flu:  Marland Kitchen Wash your hands  often with soap and warm water for at least 20 seconds.  If soap and  water are not readily available, use an alcohol-based hand sanitizer with at least 60% alcohol.  . If coughing or sneezing, cover your mouth and nose by coughing or sneezing into the elbow areas of your shirt or coat, into a tissue or into your sleeve (not your hands). . Avoid shaking hands with others and consider head nods or verbal greetings only. . Avoid touching your eyes, nose, or mouth with unwashed hands.  . Avoid close contact with people who are sick. . Avoid places or events with large numbers of people in one location, like concerts or sporting events. . Carefully consider travel plans you have or are making. . If you are planning any travel outside or inside the [place], visit the CDC's Travelers' Health webpage for the latest health notices. . If you have some symptoms but not all symptoms, continue to monitor at home and seek medical attention if your symptoms worsen. . If you are having a medical emergency, call 911.  HOME CARE . Only take medications as instructed by your medical team. . Drink plenty of fluids and get plenty of rest. . A steam or ultrasonic humidifier can help if you have congestion.   GET HELP RIGHT AWAY IF YOU HAVE EMERGENCY WARNING SIGNS** FOR COVID-19. If you or someone is showing any of these signs seek emergency medical care immediately. Call 911 or proceed to your closest emergency facility if: . You develop worsening high fever. . Trouble breathing . Bluish lips or face . Persistent pain or pressure in the chest . New confusion . Inability to wake or stay awake . You cough up blood. . Your symptoms become more severe  **This list is not all possible symptoms. Contact your medical provider for any symptoms that are sever or concerning to you.  MAKE SURE YOU   Understand these instructions.  Will watch your condition.  Will get help right away if you are not doing well or get worse.  Your e-visit answers were reviewed by a board certified  advanced clinical practitioner to complete your personal care plan.  Depending on the condition, your plan could have included both over the counter or prescription medications.  If there is a problem please reply once you have received a response from your provider.  Your safety is important to [place].  If you have drug allergies check your prescription carefully.    You can use MyChart to ask questions about today's visit, request a non-urgent call back, or ask for a work or school excuse for 24 hours related to this e-Visit. If it has been greater than 24 hours you will need to follow up with your provider, or enter a new e-Visit to address those concerns. You will get an e-mail in the next two days asking about your experience.  I hope that your e-visit has been valuable and will speed your recovery. Thank you for using e-visits.   Greater than 5 minutes, yet less than 10 minutes of time have been spent researching, coordinating, and implementing care for this patient today

## 2019-12-05 ENCOUNTER — Other Ambulatory Visit: Payer: Self-pay | Admitting: Family Medicine

## 2019-12-05 MED FILL — HYDROCHLOROTHIAZIDE 25 MG T: 25 | 90 days supply | Qty: 90 | Fill #0

## 2019-12-08 ENCOUNTER — Encounter: Payer: Self-pay | Admitting: Family Medicine

## 2019-12-08 ENCOUNTER — Telehealth (INDEPENDENT_AMBULATORY_CARE_PROVIDER_SITE_OTHER): Payer: No Typology Code available for payment source | Admitting: Family Medicine

## 2019-12-08 DIAGNOSIS — J209 Acute bronchitis, unspecified: Secondary | ICD-10-CM

## 2019-12-08 DIAGNOSIS — I10 Essential (primary) hypertension: Secondary | ICD-10-CM | POA: Diagnosis not present

## 2019-12-08 MED ORDER — HYDROCHLOROTHIAZIDE 25 MG PO TABS: 25.0000 mg | ORAL_TABLET | Freq: Every day | ORAL | 1 refills | Status: DC

## 2019-12-08 MED ORDER — HYDROCOD POLST-CPM POLST ER 10-8 MG/5ML PO SUER: 5.0000 mL | Freq: Two times a day (BID) | ORAL | 0 refills | Status: DC | PRN

## 2019-12-08 MED ORDER — AZITHROMYCIN 250 MG PO TABS: ORAL_TABLET | ORAL | 0 refills | Status: DC

## 2019-12-08 MED ORDER — ENALAPRIL MALEATE 10 MG PO TABS: 10.0000 mg | ORAL_TABLET | Freq: Every day | ORAL | 1 refills | Status: DC

## 2019-12-08 MED FILL — AZITHROMYCIN 250 MG TABLET: 250 | 5 days supply | Qty: 6 | Fill #0

## 2019-12-08 MED FILL — HYDROCODONE-CHLORPHEN ER SU: 10-8 | 14 days supply | Qty: 140 | Fill #0

## 2019-12-08 MED FILL — ENALAPRIL MALEATE 10 MG TAB: 10 | 90 days supply | Qty: 90 | Fill #0

## 2019-12-08 NOTE — Progress Notes (Signed)
Virtual Visit via Video Note  I connected with Kimberly Wall on 12/08/19 at 9:56am  by a video enabled telemedicine application and verified that I am speaking with the correct person using two identifiers.      Pt location: at home   Physician location:  In office, [facility], Vic Blackbird MD     On call: patient and physician   I discussed the limitations of evaluation and management by telemedicine and the availability of in person appointments. The patient expressed understanding and agreed to proceed.  History of Present Illness: Telehealth visit in the setting of COVID-19 pandemic.  Patient has had cough for the past 4 weeks that is not improving.  Initially had some sinus drainage mostly postnasal drip she has been using Allegra also use Afrin and Delsym for cough the past few weeks and has not improved.  She is not had any fever no loss of taste or smell.  Cough is productive mostly of thick white discharge.  She does not have any wheezing difficulty breathing.  She did get Covid testing done about 3 weeks ago which was negative.  She does not have any GI symptoms accompanying.  She does not have any known history of asthma or COPD she is a non-smoker.  Telehealth visit she does not have any facial pain when she palpated did not feel any enlarged lymph nodes in the cervical region   Observations/Objective: NAD , normal WOB, normal speech, harsh bronchitic cough, no audible wheeze   Assessment and Plan: Acute bronchitis- bronchitis s/p likely viral illness, COVID negative, based on prolonged symptoms cover for bacterial superinfection as she had sinus drainage symptoms to start, zpak, tussionex, change to nasal steroid to treat post nasal drip for 2 weeks, continue allegra D/C afrin   HTN- enalapril refilled  F/U in office in March 19th for CPE - fasting labs   Follow Up Instructions:    I discussed the assessment and treatment plan with the patient. The patient  was provided an opportunity to ask questions and all were answered. The patient agreed with the plan and demonstrated an understanding of the instructions.   The patient was advised to call back or seek an in-person evaluation if the symptoms worsen or if the condition fails to improve as anticipated.  I provided 14 minutes of non-face-to-face time during this encounter. End Time 10:10am   Vic Blackbird, MD

## 2019-12-15 ENCOUNTER — Telehealth: Payer: Self-pay | Admitting: *Deleted

## 2019-12-15 NOTE — Telephone Encounter (Signed)
Okay to schedule- see if pt able to come in Tomorrow or Wed Otherwise can see Crystal if needed

## 2019-12-15 NOTE — Telephone Encounter (Signed)
Received call from patient.   Reports that she had video visit on 12/08/2019 with PCP for cough/ bronchitis. States that she was given cough medication and Z-Pack. Reports that cough and congestion is almost unchanged.   Inquired as to if she should schedule OV to have MD assess her lungs.   MD please advise.

## 2019-12-16 ENCOUNTER — Encounter: Payer: Self-pay | Admitting: Nurse Practitioner

## 2019-12-16 ENCOUNTER — Ambulatory Visit (INDEPENDENT_AMBULATORY_CARE_PROVIDER_SITE_OTHER): Payer: No Typology Code available for payment source | Admitting: Nurse Practitioner

## 2019-12-16 ENCOUNTER — Other Ambulatory Visit: Payer: Self-pay

## 2019-12-16 VITALS — BP 124/84 | HR 69 | Temp 98.8°F | Resp 15 | Ht 62.0 in | Wt 228.5 lb

## 2019-12-16 DIAGNOSIS — J069 Acute upper respiratory infection, unspecified: Secondary | ICD-10-CM

## 2019-12-16 MED ORDER — DOXYCYCLINE HYCLATE 100 MG PO TABS: 100.0000 mg | ORAL_TABLET | Freq: Two times a day (BID) | ORAL | 0 refills | Status: AC

## 2019-12-16 MED ORDER — PREDNISONE 10 MG PO TABS: ORAL_TABLET | ORAL | 0 refills | Status: DC

## 2019-12-16 MED FILL — DOXYCYCLINE HYCLATE 100 MG: 100 | 7 days supply | Qty: 14 | Fill #0

## 2019-12-16 MED FILL — predniSONE 10 MG TABS: 10 | 6 days supply | Qty: 21 | Fill #0

## 2019-12-16 NOTE — Telephone Encounter (Signed)
Please schedule in patient OV

## 2019-12-16 NOTE — Progress Notes (Signed)
Acute Office Visit  Subjective:    Patient ID: Kimberly Wall, female    DOB: 04-13-1961, 59 y.o.   MRN: [id number]  Chief Complaint: symptoms of cough and congestion is almost unchanged since starting and finishing treatment from 12/08/2019.  HPI Kimberly Wall is a 59 year old female presenting to the clinic for acute visit related to reports that she had video visit on 12/08/2019 with PCP for cough/ bronchitis. At that time she had cough for the 4 weeks that was not improving.  Initially had some sinus drainage mostly postnasal drip she had been using Allegra also use Afrin and Delsym for cough the past few weeks and had not improved.  She had not had any fever no loss of taste or smell.  Cough was productive mostly of thick white discharge.  She did not have any wheezing or difficulty breathing.  She did get Covid testing done about 3 weeks prior to the video appt that was on [id number] which was negative.  She does not have any GI symptoms accompanying.  She does not have any known history of asthma or COPD she is a non-smoker. 12/08/2019 she was treated with cough medication and Z-Pack. Today she reports that her cough and congestion is almost unchanged.   Today her sxs continue not worse but without improvement. Sxs include cough with some darker colored production if any. She stated her spouse is worried that the sound of her cough has changed from throat to lower sound.She denied fever/chills, los of taste or loss of smell, no sick contacts, no others with similar sxs, no SOB or difficulty breathing, no wheezing. She has finished Z-pack, taking old script of tessalon pearls daytime and Tusinex at night only as needed for cough.    Past Medical History:  Diagnosis Date  . Allergy    seasonal  . Arthritis    HANDS  . Complication of anesthesia   . Family history of cancer   . Hypertension   . Ovarian cyst    Sounds like this is a misinterpretation and she is referring to her fibroid  . PONV  (postoperative nausea and vomiting)     Past Surgical History:  Procedure Laterality Date  . PILONIDAL CYST EXCISION  1987  . ROBOTIC ASSISTED TOTAL HYSTERECTOMY WITH BILATERAL SALPINGO OOPHERECTOMY Bilateral 01/31/2018   Procedure: XI ROBOTIC ASSISTED TOTAL HYSTERECTOMY (FOR UTERUS GREATER THAN  250 GRAMS)WITH BILATERAL SALPINGO OOPHORECTOMY;  Surgeon: Everitt Amber, MD;  Location: [facility];  Service: Gynecology;  Laterality: Bilateral;    Family History  Problem Relation Age of Onset  . Arthritis Mother   . Cancer Mother        glioblastoma  . Varicose Veins Mother   . Arthritis Father   . Diabetes Father   . Hearing loss Father   . Heart disease Father   . Hypertension Father   . Vision loss Father   . Cancer Maternal Grandmother        throat  . Varicose Veins Maternal Grandmother   . Stroke Maternal Grandfather   . Stroke Paternal Grandfather   . Thyroid disease Sister   . Cancer Maternal Aunt        breast  . Kidney disease Paternal Grandmother   . Breast cancer Cousin        mat cousin, daughter of aunt with breast cancer, dx in her 35s  . Breast cancer Cousin        mat first cousin, daughter of aunt  without cancer, dx in her 71s  . Colon cancer Cousin 68       mat cousin; son of mat uncle    Social History   Socioeconomic History  . Marital status: Married    Spouse name: Not on file  . Number of children: Not on file  . Years of education: Not on file  . Highest education level: Not on file  Occupational History  . Not on file  Tobacco Use  . Smoking status: Never Smoker  . Smokeless tobacco: Never Used  Substance and Sexual Activity  . Alcohol use: Yes    Comment: 1/month  . Drug use: No  . Sexual activity: Yes    Birth control/protection: Other-see comments    Comment: Husband had Vasectomy  Other Topics Concern  . Not on file  Social History Narrative  . Not on file   Social Determinants of Health   Financial Resource Strain:   . Difficulty  of Paying Living Expenses: Not on file  Food Insecurity:   . Worried About Charity fundraiser in the Last Year: Not on file  . Ran Out of Food in the Last Year: Not on file  Transportation Needs:   . Lack of Transportation (Medical): Not on file  . Lack of Transportation (Non-Medical): Not on file  Physical Activity:   . Days of Exercise per Week: Not on file  . Minutes of Exercise per Session: Not on file  Stress:   . Feeling of Stress : Not on file  Social Connections:   . Frequency of Communication with Friends and Family: Not on file  . Frequency of Social Gatherings with Friends and Family: Not on file  . Attends Religious Services: Not on file  . Active Member of Clubs or Organizations: Not on file  . Attends Archivist Meetings: Not on file  . Marital Status: Not on file  Intimate Partner Violence:   . Fear of Current or Ex-Partner: Not on file  . Emotionally Abused: Not on file  . Physically Abused: Not on file  . Sexually Abused: Not on file    No Known Allergies  Review of Systems  All other systems reviewed and are negative.     Objective:    Physical Exam Vitals and nursing note reviewed.  Constitutional:      General: She is not in acute distress.    Appearance: Normal appearance. She is obese. She is not ill-appearing or toxic-appearing.  HENT:     Head: Normocephalic.     Right Ear: Tympanic membrane, ear canal and external ear normal.     Left Ear: Tympanic membrane, ear canal and external ear normal.     Nose: Nose normal.     Mouth/Throat:     Mouth: Mucous membranes are moist.     Pharynx: Oropharynx is clear.  Eyes:     General: Lids are normal. Lids are everted, no foreign bodies appreciated. No scleral icterus.    Extraocular Movements: Extraocular movements intact.     Conjunctiva/sclera: Conjunctivae normal.     Pupils: Pupils are equal, round, and reactive to light.  Cardiovascular:     Rate and Rhythm: Normal rate and regular  rhythm.     Pulses: Normal pulses.     Heart sounds: Normal heart sounds.  Pulmonary:     Effort: Pulmonary effort is normal.     Breath sounds: Normal breath sounds.  Abdominal:     General: Abdomen is flat.  Bowel sounds are normal.     Palpations: Abdomen is soft. There is no hepatomegaly or splenomegaly.  Musculoskeletal:        General: Normal range of motion.     Cervical back: Normal range of motion and neck supple.     Right lower leg: No edema.     Left lower leg: No edema.  Lymphadenopathy:     Head:     Right side of head: No submental, submandibular, tonsillar, preauricular, posterior auricular or occipital adenopathy.     Left side of head: No submental, submandibular, tonsillar, preauricular, posterior auricular or occipital adenopathy.     Cervical: No cervical adenopathy.  Skin:    General: Skin is warm and dry.     Capillary Refill: Capillary refill takes less than 2 seconds.  Neurological:     General: No focal deficit present.     Mental Status: She is alert and oriented to person, place, and time.  Psychiatric:        Attention and Perception: Attention and perception normal.        Mood and Affect: Mood and affect normal.        Speech: Speech normal.        Behavior: Behavior normal. Behavior is cooperative.        Cognition and Memory: Cognition normal.    BP 124/84   Pulse 69   Temp 98.8 F (37.1 C) (Oral)   Resp 15   Ht 5\' 2"  (1.575 m)   Wt 228 lb 8 oz (103.6 kg)   SpO2 98%   BMI 41.79 kg/m  Wt Readings from Last 3 Encounters:  12/16/19 228 lb 8 oz (103.6 kg)  12/26/18 235 lb (106.6 kg)  11/07/18 235 lb (106.6 kg)    Health Maintenance Due  Topic Date Due  . Hepatitis C Screening  Jun 30, 1961  . HIV Screening  02/24/1978  . COLONOSCOPY  02/24/2013      Assessment & Plan:   Problem List Items Addressed This Visit    None    Visit Diagnoses    Upper respiratory tract infection, unspecified type    -  Primary   Relevant Medications    doxycycline (VIBRA-TABS) 100 MG tablet   predniSONE (DELTASONE) 10 MG tablet     Follow Up: as needed for non resolving or worsening symptoms  Annie Main, FNP

## 2019-12-26 ENCOUNTER — Ambulatory Visit (INDEPENDENT_AMBULATORY_CARE_PROVIDER_SITE_OTHER): Payer: No Typology Code available for payment source | Admitting: Family Medicine

## 2019-12-26 ENCOUNTER — Encounter: Payer: Self-pay | Admitting: Family Medicine

## 2019-12-26 ENCOUNTER — Other Ambulatory Visit: Payer: Self-pay

## 2019-12-26 VITALS — BP 128/68 | HR 78 | Temp 98.7°F | Resp 14 | Ht 62.0 in | Wt 226.0 lb

## 2019-12-26 DIAGNOSIS — R059 Cough, unspecified: Secondary | ICD-10-CM

## 2019-12-26 DIAGNOSIS — Z13220 Encounter for screening for lipoid disorders: Secondary | ICD-10-CM

## 2019-12-26 DIAGNOSIS — R05 Cough: Secondary | ICD-10-CM

## 2019-12-26 DIAGNOSIS — I8393 Asymptomatic varicose veins of bilateral lower extremities: Secondary | ICD-10-CM

## 2019-12-26 DIAGNOSIS — F39 Unspecified mood [affective] disorder: Secondary | ICD-10-CM

## 2019-12-26 DIAGNOSIS — Z0000 Encounter for general adult medical examination without abnormal findings: Secondary | ICD-10-CM | POA: Diagnosis not present

## 2019-12-26 DIAGNOSIS — Z1159 Encounter for screening for other viral diseases: Secondary | ICD-10-CM

## 2019-12-26 DIAGNOSIS — E669 Obesity, unspecified: Secondary | ICD-10-CM

## 2019-12-26 DIAGNOSIS — L309 Dermatitis, unspecified: Secondary | ICD-10-CM

## 2019-12-26 DIAGNOSIS — I10 Essential (primary) hypertension: Secondary | ICD-10-CM

## 2019-12-26 DIAGNOSIS — R600 Localized edema: Secondary | ICD-10-CM

## 2019-12-26 DIAGNOSIS — R0781 Pleurodynia: Secondary | ICD-10-CM

## 2019-12-26 NOTE — Patient Instructions (Addendum)
Referral to therapy Referral to healthy weight/wellness We will call with lab results  Try the lotrisone cream twice a day to feet Compression hose during the day  CXR -  [place]  F/U 6 months

## 2019-12-26 NOTE — Progress Notes (Signed)
Subjective:    Patient ID: Kimberly Wall, female    DOB: 11/07/1960, 59 y.o.   MRN: [id number]  Patient presents for Annual Exam (is fasting) and Cough (lingering cough from URI- R sided rib pain from cough)   Pt   Cough is still lingering, has improved some   Rib pain on right side, started on Wed   No SOB  she recently completed prednisone, doxycycline  she has used flonase and allegra No smoking, no COPD/ Asthma    Dermatologist- [facility]     Works part time, moving around on her [place] , she has noticed more veins on her feet, mild swelling, also has a brownish like discoloration      Significant stressors at home, she has had counseling through the years. 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 She harbors some resentment issues towards her husband for not helping and providing some support. She actually lost 2 jobs because she couldn't care for her MOL and keep up with her own work. Feels she has lost her identity through the way. Her husband stated she needed to be screened for depression because she doesn't laugh at things like she should and always seems irritable. She was very taken back by this, since she has a degree in counseling.   Had some couseling last year  Note her mother died  Mother 86 father a few years later She is worried about finances, now only making 1/4 of her typical salary.   Also stated her husband wants her "referred to the weight/wellness clinic" for [place]. When I asked if she wanted to pursue this and her concerns, did not have a direct answer. Later in our conversation she states she knows she has excess weight and is obese but her husband made a statement the other day about her needing the covid 19 vaccine because she was Obese and had Hypertension which she was very hurt by.     Mammogram UTD   Hysterectomy - GYN Appt next week    Immunizations  UTD, TDAP/  Flu / shingrix     Colonoscopy 5  Years  - [facility]  ( off [place] )    Product manager with with eye doctor and dentist     Review Of Systems:  GEN- denies fatigue, fever, weight loss,weakness, recent illness HEENT- denies eye drainage, change in vision, nasal discharge, CVS- denies chest pain, palpitations RESP- denies SOB, cough, wheeze ABD- denies N/V, change in stools, abd pain GU- denies dysuria, hematuria, dribbling, incontinence MSK- denies joint pain, muscle aches, injury Neuro- denies headache, dizziness, syncope, seizure activity       Objective:    BP 128/68   Pulse 78   Temp 98.7 F (37.1 C) (Temporal)   Resp 14   Ht 5\' 2"  (1.575 m)   Wt 226 lb (102.5 kg)   SpO2 99%   BMI 41.34 kg/m  GEN- NAD, alert and oriented x3 HEENT- PERRL, EOMI, non injected sclera, pink conjunctiva, MMM, oropharynx clear Neck- Supple, no thyromegaly CVS- RRR, no murmur MSK- mild TTP right lower ribs, no bruising on skin RESP-CTAB ABD-NABS,soft,NT,ND Psych- tearful at times, not anxious, good eye contact, normal speech  PHQ9 score  4 EXT- trace pedal edema, varicose veins, few spider veins Skin in tact brownish dermatitis across toes bilat  Pulses- Radial, DP- 2+        Assessment &  Plan:      Problem List Items Addressed This Visit      Unprioritized   Class 3 obesity    With multiple other factors going on, we did not get time to discuss her weight loss journey She stated she wanted to proceed with the referral       Relevant Orders   Amb Ref to Medical Weight Management   Essential hypertension    Controlled no changes       Relevant Orders   CBC with Differential/Platelet (Completed)   COMPLETE METABOLIC PANEL WITH GFR (Completed)   Lipid panel (Completed)   TSH (Completed)   Amb Ref to Medical Weight Management    Other Visit Diagnoses    Routine general medical examination at a health care facility    -  Primary   CPE done,fasting labs done   Relevant Orders    Hemoglobin A1c (Completed)   Screening for hypercholesterolemia       Relevant Orders   CBC with Differential/Platelet (Completed)   COMPLETE METABOLIC PANEL WITH GFR (Completed)   Lipid panel (Completed)   Mood disorder ([place])       Referral to psychotherapy. I think family would benefit from couples therapy, husband has declined in the past    Relevant Orders   Ambulatory referral to Psychology   Need for hepatitis C screening test       Relevant Orders   Hepatitis C antibody   Pedal edema       Compression hose given,   Cough       obtain CXR, rib pain could be MSK with chronic cough, no sign of PNA, will also f/u rib fracture   Relevant Orders   DG Chest 2 View   Rib pain on right side       Relevant Orders   DG Chest 2 View   Varicose veins of both lower extremities, unspecified whether complicated       Dermatitis of foot       lotrisone cream BID , if not improved can schedule with GYN      Note: This dictation was prepared with Dragon dictation along with smaller phrase technology. Any transcriptional errors that result from this process are unintentional.

## 2019-12-28 ENCOUNTER — Encounter: Payer: Self-pay | Admitting: Family Medicine

## 2019-12-28 DIAGNOSIS — E669 Obesity, unspecified: Secondary | ICD-10-CM | POA: Insufficient documentation

## 2019-12-28 DIAGNOSIS — E66812 Obesity, class 2: Secondary | ICD-10-CM | POA: Insufficient documentation

## 2019-12-28 NOTE — Assessment & Plan Note (Signed)
Controlled no changes 

## 2019-12-28 NOTE — Assessment & Plan Note (Signed)
With multiple other factors going on, we did not get time to discuss her weight loss journey She stated she wanted to proceed with the referral

## 2019-12-29 LAB — LIPID PANEL
Cholesterol: 155 mg/dL (ref <200)
HDL: 43 mg/dL — ABNORMAL LOW (ref >=50)
LDL Cholesterol (Calc): 90 mg/dL (calc)
Non-HDL Cholesterol (Calc): 112 mg/dL (calc) (ref <130)
Total CHOL/HDL Ratio: 3.6 (calc) (ref <5.0)
Triglycerides: 119 mg/dL (ref <150)

## 2019-12-29 LAB — CBC WITH DIFFERENTIAL/PLATELET
Absolute Monocytes: 583 cells/uL (ref 200–950)
Basophils Absolute: 79 cells/uL (ref 0–200)
Basophils Relative: 1.1 %
Eosinophils Absolute: 194 cells/uL (ref 15–500)
Eosinophils Relative: 2.7 %
HCT: 43.5 % (ref 35.0–45.0)
Hemoglobin: 14.6 g/dL (ref 11.7–15.5)
Lymphs Abs: 2218 cells/uL (ref 850–3900)
MCH: 30.9 pg (ref 27.0–33.0)
MCHC: 33.6 g/dL (ref 32.0–36.0)
MCV: 92 fL (ref 80.0–100.0)
MPV: 11 fL (ref 7.5–12.5)
Monocytes Relative: 8.1 %
Neutro Abs: 4126 cells/uL (ref 1500–7800)
Neutrophils Relative %: 57.3 %
Platelets: 207 10*3/uL (ref 140–400)
RBC: 4.73 10*6/uL (ref 3.80–5.10)
RDW: 12.6 % (ref 11.0–15.0)
Total Lymphocyte: 30.8 %
WBC: 7.2 10*3/uL (ref 3.8–10.8)

## 2019-12-29 LAB — COMPLETE METABOLIC PANEL WITH GFR
AG Ratio: 1.2 (calc) (ref 1.0–2.5)
ALT: 18 U/L (ref 6–29)
AST: 16 U/L (ref 10–35)
Albumin: 3.6 g/dL (ref 3.6–5.1)
Alkaline phosphatase (APISO): 74 U/L (ref 37–153)
BUN: 14 mg/dL (ref 7–25)
CO2: 25 mmol/L (ref 20–32)
Calcium: 8.7 mg/dL (ref 8.6–10.4)
Chloride: 105 mmol/L (ref 98–110)
Creat: 0.75 mg/dL (ref 0.50–1.05)
GFR, Est African American: 103 mL/min/{1.73_m2} (ref >=60)
GFR, Est Non African American: 89 mL/min/{1.73_m2} (ref >=60)
Globulin: 3 g/dL (calc) (ref 1.9–3.7)
Glucose, Bld: 103 mg/dL — ABNORMAL HIGH (ref 65–99)
Potassium: 4.2 mmol/L (ref 3.5–5.3)
Sodium: 141 mmol/L (ref 135–146)
Total Bilirubin: 0.5 mg/dL (ref 0.2–1.2)
Total Protein: 6.6 g/dL (ref 6.1–8.1)

## 2019-12-29 LAB — T3, FREE: T3, Free: 3 pg/mL (ref 2.3–4.2)

## 2019-12-29 LAB — HEPATITIS C ANTIBODY
Hepatitis C Ab: NONREACTIVE
SIGNAL TO CUT-OFF: 0.02 (ref <1.00)

## 2019-12-29 LAB — HEMOGLOBIN A1C
Hgb A1c MFr Bld: 5.2 % of total Hgb (ref <5.7)
Mean Plasma Glucose: 103 (calc)
eAG (mmol/L): 5.7 (calc)

## 2019-12-29 LAB — TSH: TSH: 5.51 mIU/L — ABNORMAL HIGH (ref 0.40–4.50)

## 2019-12-29 LAB — T4, FREE: Free T4: 1 ng/dL (ref 0.8–1.8)

## 2019-12-29 LAB — TEST AUTHORIZATION

## 2019-12-31 ENCOUNTER — Other Ambulatory Visit: Payer: Self-pay | Admitting: *Deleted

## 2019-12-31 DIAGNOSIS — E039 Hypothyroidism, unspecified: Secondary | ICD-10-CM

## 2019-12-31 DIAGNOSIS — E038 Other specified hypothyroidism: Secondary | ICD-10-CM

## 2019-12-31 MED ORDER — LEVOTHYROXINE SODIUM 50 MCG PO TABS: 50.0000 ug | ORAL_TABLET | Freq: Every day | ORAL | 3 refills | Status: DC

## 2019-12-31 MED FILL — LEVOTHYROXINE 50 MCG TABLET: 50 | 90 days supply | Qty: 90 | Fill #0

## 2020-01-02 ENCOUNTER — Ambulatory Visit: Payer: No Typology Code available for payment source | Admitting: Obstetrics and Gynecology

## 2020-01-05 ENCOUNTER — Ambulatory Visit: Payer: No Typology Code available for payment source | Attending: Internal Medicine

## 2020-01-05 DIAGNOSIS — Z23 Encounter for immunization: Secondary | ICD-10-CM

## 2020-01-05 NOTE — Progress Notes (Signed)
   Covid-19 Vaccination Clinic  Name:  Kimberly Wall    MRN: [id number] DOB: 04/13/1961  01/05/2020  [name] was observed post Covid-19 immunization for 15 minutes without incident. She was provided with Vaccine Information Sheet and instruction to access the V-Safe system.   [name] was instructed to call 911 with any severe reactions post vaccine: Marland Kitchen Difficulty breathing  . Swelling of face and throat  . A fast heartbeat  . A bad rash all over body  . Dizziness and weakness   Immunizations Administered    Name Date Dose VIS Date Route   Pfizer COVID-19 Vaccine 01/05/2020 12:31 PM 0.3 mL 09/19/2019 Intramuscular   Manufacturer: [place]   Lot: [id number]   [place]: [id number]

## 2020-01-06 ENCOUNTER — Ambulatory Visit: Payer: No Typology Code available for payment source | Admitting: Obstetrics and Gynecology

## 2020-01-28 ENCOUNTER — Ambulatory Visit: Payer: No Typology Code available for payment source | Attending: Internal Medicine

## 2020-01-28 DIAGNOSIS — Z23 Encounter for immunization: Secondary | ICD-10-CM

## 2020-01-28 NOTE — Progress Notes (Signed)
   Covid-19 Vaccination Clinic  Name:  Kimberly Wall    MRN: [id number] DOB: 1961-06-27  01/28/2020  Kimberly Wall was observed post Covid-19 immunization for 15 minutes without incident. She was provided with Vaccine Information Sheet and instruction to access the V-Safe system.   Kimberly Wall was instructed to call 911 with any severe reactions post vaccine: Marland Kitchen Difficulty breathing  . Swelling of face and throat  . A fast heartbeat  . A bad rash all over body  . Dizziness and weakness   Immunizations Administered    Name Date Dose VIS Date Route   Pfizer COVID-19 Vaccine 01/28/2020  9:37 AM 0.3 mL 12/03/2018 Intramuscular   Manufacturer: [place]   Lot: [id number]   [place]: [id number]

## 2020-02-03 ENCOUNTER — Other Ambulatory Visit: Payer: Self-pay

## 2020-02-03 NOTE — Progress Notes (Signed)
59 y.o. [id number] Married Caucasian female here for annual exam.    Complaining of urinary frequency since Hyst. Voiding every 2 - 3 hours.  At times, she can have a tiny bit of leakage.  Takes HCTZ, which is not new. Denies dysuria or hematuria.  1.5 - 2 coffees per day.  1 tea at lunch sometimes.   Denies constipation and fecal incontinence.  No problems with sexual functioning.  No bulge from vagina.   Recently dx with hypothyroidism.  Followed by PCP.   Received her Covid vaccine.   PCP: Vic Blackbird, MD  Patient's last menstrual period was 11/13/2017.           Sexually active: Yes.    The current method of family planning is Vasectomy/status post hysterectomy/BSO.    Exercising: Yes.    walks all day at work, yard work Smoker:  no  Health Maintenance: Pap: 11-26-17 Neg:Neg HR HPV, 2017 normal per patient History of abnormal Pap:  no MMG: 06-13-19 3D/Neg/density B/BiRads1 Colonoscopy: 2014 normal per patient, polyps removed;next ?10 years BMD:   n/a  Result  n/a TDaP: 05-16-18 Gardasil:   no HIV: donated blood in past Hep C:12-26-19 Neg Screening Labs:  PCP.   reports that she has never smoked. She has never used smokeless tobacco. She reports current alcohol use. She reports that she does not use drugs.  Past Medical History:  Diagnosis Date  . Allergy    seasonal  . Arthritis    HANDS  . Complication of anesthesia   . Family history of cancer   . Hypertension   . Ovarian cyst    Sounds like this is a misinterpretation and she is referring to her fibroid  . PONV (postoperative nausea and vomiting)   . Thyroid disease    hypothyroidism    Past Surgical History:  Procedure Laterality Date  . PILONIDAL CYST EXCISION  1987  . ROBOTIC ASSISTED TOTAL HYSTERECTOMY WITH BILATERAL SALPINGO OOPHERECTOMY Bilateral 01/31/2018   Procedure: XI ROBOTIC ASSISTED TOTAL HYSTERECTOMY (FOR UTERUS GREATER THAN  250 GRAMS)WITH BILATERAL SALPINGO OOPHORECTOMY;  Surgeon: Everitt Amber, MD;  Location: [facility];  Service: Gynecology;  Laterality: Bilateral;    Current Outpatient Medications  Medication Sig Dispense Refill  . acetaminophen (TYLENOL) 325 MG tablet Take 650 mg by mouth every 4 (four) hours as needed for moderate pain or headache.    . enalapril (VASOTEC) 10 MG tablet Take 1 tablet (10 mg total) by mouth daily. 90 tablet 1  . hydrochlorothiazide (HYDRODIURIL) 25 MG tablet Take 1 tablet (25 mg total) by mouth daily. 90 tablet 1  . levothyroxine (SYNTHROID) 50 MCG tablet Take 1 tablet (50 mcg total) by mouth daily. 90 tablet 3   No current facility-administered medications for this visit.    Family History  Problem Relation Age of Onset  . Arthritis Mother   . Cancer Mother        glioblastoma  . Varicose Veins Mother   . Arthritis Father   . Diabetes Father   . Hearing loss Father   . Heart disease Father   . Hypertension Father   . Vision loss Father   . Cancer Maternal Grandmother        throat  . Varicose Veins Maternal Grandmother   . Stroke Maternal Grandfather   . Stroke Paternal Grandfather   . Thyroid disease Sister        thyroidectomy  . Cancer Maternal Aunt        breast  .  Kidney disease Paternal Grandmother   . Breast cancer Cousin        mat cousin, daughter of aunt with breast cancer, dx in her 22s  . Breast cancer Cousin        mat first cousin, daughter of aunt without cancer, dx in her 30s  . Colon cancer Cousin 6       mat cousin; son of mat uncle    Review of Systems  Genitourinary: Positive for frequency.  All other systems reviewed and are negative.   Exam:   BP 140/82 (Cuff Size: Large)   Pulse 70   Temp 97.8 F (36.6 C) (Temporal)   Resp 14   Ht 5' 2.5" (1.588 m)   Wt 229 lb (103.9 kg)   LMP 11/13/2017   BMI 41.22 kg/m     General appearance: alert, cooperative and appears stated age Head: normocephalic, without obvious abnormality, atraumatic Neck: no adenopathy, supple, symmetrical, trachea  midline and thyroid normal to inspection and palpation Lungs: clear to auscultation bilaterally Breasts: normal appearance, no masses or tenderness, No nipple retraction or dimpling, No nipple discharge or bleeding, No axillary adenopathy Heart: regular rate and rhythm Abdomen: soft, non-tender; no masses, no organomegaly Extremities: extremities normal, atraumatic, no cyanosis or edema Skin: skin color, texture, turgor normal. No rashes or lesions Lymph nodes: cervical, supraclavicular, and axillary nodes normal. Neurologic: grossly normal  Pelvic: External genitalia:  no lesions              No abnormal inguinal nodes palpated.              Urethra:  normal appearing urethra with no masses, tenderness or lesions              Bartholins and Skenes: normal                 Vagina: normal appearing vagina with normal color and discharge, no lesions.  Third degree rectocele.  First degree anterior vaginal and slight apical prolapse               Cervix:  Absent.               Pap taken: No. Bimanual Exam:  Uterus:  Absent.              Adnexa: no mass, fullness, tenderness              Rectal exam: Yes.  .  Confirms.              Anus:  normal sphincter tone, no lesions  Chaperone was present for exam.  Assessment:   Well woman visit with normal exam. Status post robotic TLH/BSO with Dr. Denman George.  Fibroids. Rectocele.  Hypothyroidism.   Plan: Mammogram screening discussed. Self breast awareness reviewed. Pap and HR HPV as above. Guidelines for Calcium, Vitamin D, regular exercise program including cardiovascular and weight bearing exercise. We discussed her prolapse and options for care - observation, pessary, surgery.  ACOG HO on prolapse to patient.  We reviewed risk factors for prolapse.  Follow up annually and prn.   After visit summary provided.

## 2020-02-04 ENCOUNTER — Ambulatory Visit (INDEPENDENT_AMBULATORY_CARE_PROVIDER_SITE_OTHER): Payer: No Typology Code available for payment source | Admitting: Obstetrics and Gynecology

## 2020-02-04 ENCOUNTER — Encounter: Payer: Self-pay | Admitting: Obstetrics and Gynecology

## 2020-02-04 VITALS — BP 140/82 | HR 70 | Temp 97.8°F | Resp 14 | Ht 62.5 in | Wt 229.0 lb

## 2020-02-04 DIAGNOSIS — N816 Rectocele: Secondary | ICD-10-CM | POA: Diagnosis not present

## 2020-02-04 DIAGNOSIS — Z01419 Encounter for gynecological examination (general) (routine) without abnormal findings: Secondary | ICD-10-CM | POA: Diagnosis not present

## 2020-02-04 NOTE — Patient Instructions (Signed)

## 2020-02-23 ENCOUNTER — Other Ambulatory Visit: Payer: Self-pay

## 2020-02-23 ENCOUNTER — Other Ambulatory Visit: Payer: No Typology Code available for payment source

## 2020-02-23 DIAGNOSIS — E038 Other specified hypothyroidism: Secondary | ICD-10-CM

## 2020-02-23 LAB — TSH: TSH: 2.01 mIU/L (ref 0.40–4.50)

## 2020-04-02 MED FILL — LEVOTHYROXINE 50 MCG TABLET: 50 | 90 days supply | Qty: 90 | Fill #1

## 2020-04-16 MED FILL — HYDROCHLOROTHIAZIDE 25 MG T: 25 | 90 days supply | Qty: 90 | Fill #0

## 2020-04-19 ENCOUNTER — Other Ambulatory Visit: Payer: Self-pay

## 2020-04-19 ENCOUNTER — Emergency Department (HOSPITAL_BASED_OUTPATIENT_CLINIC_OR_DEPARTMENT_OTHER): Payer: No Typology Code available for payment source

## 2020-04-19 ENCOUNTER — Observation Stay (HOSPITAL_COMMUNITY): Payer: No Typology Code available for payment source | Admitting: Certified Registered"

## 2020-04-19 ENCOUNTER — Encounter (HOSPITAL_COMMUNITY)
Admission: EM | Disposition: A | Discharge status: Other Acute Inpatient Hospital | Payer: Self-pay | Source: Home / Self Care | Attending: Emergency Medicine

## 2020-04-19 ENCOUNTER — Observation Stay (HOSPITAL_BASED_OUTPATIENT_CLINIC_OR_DEPARTMENT_OTHER)
Admission: EM | Admit: 2020-04-19 | Discharge: 2020-04-20 | Disposition: A | Discharge status: Other Acute Inpatient Hospital | Payer: No Typology Code available for payment source | Attending: Emergency Medicine | Admitting: Emergency Medicine

## 2020-04-19 ENCOUNTER — Encounter (HOSPITAL_BASED_OUTPATIENT_CLINIC_OR_DEPARTMENT_OTHER): Payer: Self-pay | Admitting: Emergency Medicine

## 2020-04-19 DIAGNOSIS — R519 Headache, unspecified: Secondary | ICD-10-CM | POA: Diagnosis not present

## 2020-04-19 DIAGNOSIS — R1011 Right upper quadrant pain: Secondary | ICD-10-CM | POA: Diagnosis present

## 2020-04-19 DIAGNOSIS — K8001 Calculus of gallbladder with acute cholecystitis with obstruction: Principal | ICD-10-CM | POA: Insufficient documentation

## 2020-04-19 DIAGNOSIS — K810 Acute cholecystitis: Secondary | ICD-10-CM | POA: Diagnosis present

## 2020-04-19 DIAGNOSIS — Z79899 Other long term (current) drug therapy: Secondary | ICD-10-CM | POA: Diagnosis not present

## 2020-04-19 DIAGNOSIS — K8000 Calculus of gallbladder with acute cholecystitis without obstruction: Secondary | ICD-10-CM | POA: Diagnosis present

## 2020-04-19 DIAGNOSIS — E039 Hypothyroidism, unspecified: Secondary | ICD-10-CM | POA: Diagnosis not present

## 2020-04-19 DIAGNOSIS — M546 Pain in thoracic spine: Secondary | ICD-10-CM

## 2020-04-19 DIAGNOSIS — Z20822 Contact with and (suspected) exposure to covid-19: Secondary | ICD-10-CM | POA: Insufficient documentation

## 2020-04-19 DIAGNOSIS — I10 Essential (primary) hypertension: Secondary | ICD-10-CM | POA: Diagnosis not present

## 2020-04-19 HISTORY — PX: CHOLECYSTECTOMY: SHX55

## 2020-04-19 LAB — URINALYSIS, ROUTINE W REFLEX MICROSCOPIC
Bilirubin Urine: NEGATIVE
Glucose, UA: NEGATIVE mg/dL
Ketones, ur: NEGATIVE mg/dL
Leukocytes,Ua: NEGATIVE
Nitrite: NEGATIVE
Protein, ur: NEGATIVE mg/dL
Specific Gravity, Urine: >1.03 — ABNORMAL HIGH (ref 1.005–1.030)
pH: 5.5 (ref 5.0–8.0)

## 2020-04-19 LAB — CBC WITH DIFFERENTIAL/PLATELET
Abs Immature Granulocytes: 0.02 10*3/uL (ref 0.00–0.07)
Basophils Absolute: 0.1 10*3/uL (ref 0.0–0.1)
Basophils Relative: 1 %
Eosinophils Absolute: 0.2 10*3/uL (ref 0.0–0.5)
Eosinophils Relative: 3 %
HCT: 45.4 % (ref 36.0–46.0)
Hemoglobin: 15 g/dL (ref 12.0–15.0)
Immature Granulocytes: 0 %
Lymphocytes Relative: 29 %
Lymphs Abs: 2.3 10*3/uL (ref 0.7–4.0)
MCH: 30.4 pg (ref 26.0–34.0)
MCHC: 33 g/dL (ref 30.0–36.0)
MCV: 91.9 fL (ref 80.0–100.0)
Monocytes Absolute: 0.5 10*3/uL (ref 0.1–1.0)
Monocytes Relative: 7 %
Neutro Abs: 4.7 10*3/uL (ref 1.7–7.7)
Neutrophils Relative %: 60 %
Platelets: 219 10*3/uL (ref 150–400)
RBC: 4.94 MIL/uL (ref 3.87–5.11)
RDW: 12.6 % (ref 11.5–15.5)
WBC: 7.7 10*3/uL (ref 4.0–10.5)
nRBC: 0 % (ref 0.0–0.2)

## 2020-04-19 LAB — TROPONIN I (HIGH SENSITIVITY)
Troponin I (High Sensitivity): 3 ng/L (ref <18)
Troponin I (High Sensitivity): 4 ng/L (ref <18)

## 2020-04-19 LAB — COMPREHENSIVE METABOLIC PANEL
ALT: 26 U/L (ref 0–44)
AST: 24 U/L (ref 15–41)
Albumin: 3.8 g/dL (ref 3.5–5.0)
Alkaline Phosphatase: 69 U/L (ref 38–126)
Anion gap: 10 (ref 5–15)
BUN: 16 mg/dL (ref 6–20)
CO2: 25 mmol/L (ref 22–32)
Calcium: 9.1 mg/dL (ref 8.9–10.3)
Chloride: 104 mmol/L (ref 98–111)
Creatinine, Ser: 0.65 mg/dL (ref 0.44–1.00)
GFR calc Af Amer: >60 mL/min (ref >60)
GFR calc non Af Amer: >60 mL/min (ref >60)
Glucose, Bld: 124 mg/dL — ABNORMAL HIGH (ref 70–99)
Potassium: 3.2 mmol/L — ABNORMAL LOW (ref 3.5–5.1)
Sodium: 139 mmol/L (ref 135–145)
Total Bilirubin: 0.5 mg/dL (ref 0.3–1.2)
Total Protein: 7.5 g/dL (ref 6.5–8.1)

## 2020-04-19 LAB — URINALYSIS, MICROSCOPIC (REFLEX)

## 2020-04-19 LAB — SARS CORONAVIRUS 2 BY RT PCR (HOSPITAL ORDER, PERFORMED IN ~~LOC~~ HOSPITAL LAB): SARS Coronavirus 2: NEGATIVE

## 2020-04-19 LAB — LIPASE, BLOOD: Lipase: 27 U/L (ref 11–51)

## 2020-04-19 LAB — D-DIMER, QUANTITATIVE: D-Dimer, Quant: 0.81 ug/mL-FEU — ABNORMAL HIGH (ref 0.00–0.50)

## 2020-04-19 SURGERY — LAPAROSCOPIC CHOLECYSTECTOMY
Anesthesia: General

## 2020-04-19 MED ORDER — LACTATED RINGERS IR SOLN
Status: DC | PRN
  Administered 2020-04-19: 3000 mL

## 2020-04-19 MED ORDER — LIDOCAINE 2% (20 MG/ML) 5 ML SYRINGE
INTRAMUSCULAR | Status: AC
  Filled 2020-04-19: qty 5

## 2020-04-19 MED ORDER — OXYCODONE HCL 5 MG PO TABS: 5.0000 mg | ORAL_TABLET | Freq: Once | ORAL | Status: DC | PRN

## 2020-04-19 MED ORDER — ENALAPRIL MALEATE 10 MG PO TABS
10.0000 mg | ORAL_TABLET | Freq: Every day | ORAL | Status: DC
  Administered 2020-04-19: 10 mg via ORAL
  Filled 2020-04-19 (×2): qty 1

## 2020-04-19 MED ORDER — PROPOFOL 10 MG/ML IV BOLUS
INTRAVENOUS | Status: AC
  Filled 2020-04-19: qty 20

## 2020-04-19 MED ORDER — ONDANSETRON HCL 4 MG/2ML IJ SOLN: 4.0000 mg | Freq: Four times a day (QID) | INTRAMUSCULAR | Status: DC | PRN

## 2020-04-19 MED ORDER — KETOROLAC TROMETHAMINE 30 MG/ML IJ SOLN
INTRAMUSCULAR | Status: AC
  Filled 2020-04-19: qty 1

## 2020-04-19 MED ORDER — ONDANSETRON HCL 4 MG/2ML IJ SOLN
INTRAMUSCULAR | Status: AC
  Filled 2020-04-19: qty 2

## 2020-04-19 MED ORDER — SUCCINYLCHOLINE CHLORIDE 200 MG/10ML IV SOSY
PREFILLED_SYRINGE | INTRAVENOUS | Status: AC
  Filled 2020-04-19: qty 10

## 2020-04-19 MED ORDER — MIDAZOLAM HCL 2 MG/2ML IJ SOLN
INTRAMUSCULAR | Status: AC
  Filled 2020-04-19: qty 2

## 2020-04-19 MED ORDER — OXYCODONE HCL 5 MG/5ML PO SOLN: 5.0000 mg | Freq: Once | ORAL | Status: DC | PRN

## 2020-04-19 MED ORDER — PROPOFOL 10 MG/ML IV BOLUS
INTRAVENOUS | Status: DC | PRN
  Administered 2020-04-19: 200 mg via INTRAVENOUS

## 2020-04-19 MED ORDER — KCL IN DEXTROSE-NACL 20-5-0.45 MEQ/L-%-% IV SOLN
Freq: Once | INTRAVENOUS | Status: AC
  Administered 2020-04-19: 1000 mL via INTRAVENOUS
  Filled 2020-04-19 (×2): qty 1000

## 2020-04-19 MED ORDER — MIDAZOLAM HCL 2 MG/2ML IJ SOLN
INTRAMUSCULAR | Status: DC | PRN
  Administered 2020-04-19: 2 mg via INTRAVENOUS

## 2020-04-19 MED ORDER — SCOPOLAMINE 1 MG/3DAYS TD PT72
1.0000 | MEDICATED_PATCH | TRANSDERMAL | Status: DC
  Filled 2020-04-19: qty 1

## 2020-04-19 MED ORDER — HYDROMORPHONE HCL 1 MG/ML IJ SOLN
0.2500 mg | INTRAMUSCULAR | Status: DC | PRN
  Administered 2020-04-19: 0.5 mg via INTRAVENOUS

## 2020-04-19 MED ORDER — ACETAMINOPHEN 500 MG PO TABS: 1000.0000 mg | ORAL_TABLET | Freq: Once | ORAL | Status: DC

## 2020-04-19 MED ORDER — MORPHINE SULFATE (PF) 4 MG/ML IV SOLN: 2.0000 mg | INTRAVENOUS | Status: DC | PRN

## 2020-04-19 MED ORDER — SODIUM CHLORIDE 0.9 % IV SOLN
2.0000 g | Freq: Once | INTRAVENOUS | Status: AC
  Administered 2020-04-19: 2 g via INTRAVENOUS
  Filled 2020-04-19: qty 20

## 2020-04-19 MED ORDER — SUGAMMADEX SODIUM 200 MG/2ML IV SOLN
INTRAVENOUS | Status: DC | PRN
  Administered 2020-04-19 (×2): 200 mg via INTRAVENOUS

## 2020-04-19 MED ORDER — BUPIVACAINE-EPINEPHRINE 0.25% -1:200000 IJ SOLN
INTRAMUSCULAR | Status: AC
  Filled 2020-04-19: qty 1

## 2020-04-19 MED ORDER — SCOPOLAMINE 1 MG/3DAYS TD PT72
MEDICATED_PATCH | TRANSDERMAL | Status: DC | PRN
  Administered 2020-04-19: 1 via TRANSDERMAL

## 2020-04-19 MED ORDER — IOHEXOL 350 MG/ML SOLN
100.0000 mL | Freq: Once | INTRAVENOUS | Status: AC | PRN
  Administered 2020-04-19: 77 mL via INTRAVENOUS

## 2020-04-19 MED ORDER — KETOROLAC TROMETHAMINE 30 MG/ML IJ SOLN: 30.0000 mg | Freq: Four times a day (QID) | INTRAMUSCULAR | Status: DC | PRN

## 2020-04-19 MED ORDER — ONDANSETRON HCL 4 MG/2ML IJ SOLN
4.0000 mg | Freq: Once | INTRAMUSCULAR | Status: AC
  Administered 2020-04-19: 4 mg via INTRAVENOUS
  Filled 2020-04-19: qty 2

## 2020-04-19 MED ORDER — KETOROLAC TROMETHAMINE 30 MG/ML IJ SOLN
INTRAMUSCULAR | Status: DC | PRN
  Administered 2020-04-19: 30 mg via INTRAVENOUS

## 2020-04-19 MED ORDER — PROPOFOL 500 MG/50ML IV EMUL
INTRAVENOUS | Status: DC | PRN
  Administered 2020-04-19: 25 ug/kg/min via INTRAVENOUS

## 2020-04-19 MED ORDER — ONDANSETRON HCL 4 MG/2ML IJ SOLN
INTRAMUSCULAR | Status: DC | PRN
  Administered 2020-04-19: 4 mg via INTRAVENOUS

## 2020-04-19 MED ORDER — DEXAMETHASONE SODIUM PHOSPHATE 10 MG/ML IJ SOLN
INTRAMUSCULAR | Status: DC | PRN
  Administered 2020-04-19: 10 mg via INTRAVENOUS

## 2020-04-19 MED ORDER — CHLORHEXIDINE GLUCONATE 0.12 % MT SOLN
15.0000 mL | Freq: Once | OROMUCOSAL | Status: AC
  Administered 2020-04-19: 15 mL via OROMUCOSAL

## 2020-04-19 MED ORDER — MORPHINE SULFATE (PF) 4 MG/ML IV SOLN
4.0000 mg | Freq: Once | INTRAVENOUS | Status: AC
  Administered 2020-04-19: 4 mg via INTRAVENOUS
  Filled 2020-04-19: qty 1

## 2020-04-19 MED ORDER — METOPROLOL TARTRATE 5 MG/5ML IV SOLN: 5.0000 mg | Freq: Four times a day (QID) | INTRAVENOUS | Status: DC | PRN

## 2020-04-19 MED ORDER — LIDOCAINE 2% (20 MG/ML) 5 ML SYRINGE
INTRAMUSCULAR | Status: DC | PRN
  Administered 2020-04-19: 60 mg via INTRAVENOUS

## 2020-04-19 MED ORDER — LEVOTHYROXINE SODIUM 50 MCG PO TABS
50.0000 ug | ORAL_TABLET | Freq: Every day | ORAL | Status: DC
  Administered 2020-04-20: 50 ug via ORAL
  Filled 2020-04-19: qty 1

## 2020-04-19 MED ORDER — FENTANYL CITRATE (PF) 100 MCG/2ML IJ SOLN
INTRAMUSCULAR | Status: AC
  Filled 2020-04-19: qty 2

## 2020-04-19 MED ORDER — MEPERIDINE HCL 50 MG/ML IJ SOLN: 6.2500 mg | INTRAMUSCULAR | Status: DC | PRN

## 2020-04-19 MED ORDER — HYDROCODONE-ACETAMINOPHEN 5-325 MG PO TABS: 1.0000 | ORAL_TABLET | Freq: Four times a day (QID) | ORAL | 0 refills | Status: DC | PRN

## 2020-04-19 MED ORDER — PROMETHAZINE HCL 25 MG/ML IJ SOLN: 6.2500 mg | INTRAMUSCULAR | Status: DC | PRN

## 2020-04-19 MED ORDER — BUPIVACAINE-EPINEPHRINE 0.25% -1:200000 IJ SOLN
INTRAMUSCULAR | Status: DC | PRN
  Administered 2020-04-19: 40 mL

## 2020-04-19 MED ORDER — HYDROMORPHONE HCL 1 MG/ML IJ SOLN
INTRAMUSCULAR | Status: AC
  Filled 2020-04-19: qty 2

## 2020-04-19 MED ORDER — METOCLOPRAMIDE HCL 5 MG/ML IJ SOLN
INTRAMUSCULAR | Status: AC
  Filled 2020-04-19: qty 2

## 2020-04-19 MED ORDER — ONDANSETRON 4 MG PO TBDP: 4.0000 mg | ORAL_TABLET | Freq: Four times a day (QID) | ORAL | Status: DC | PRN

## 2020-04-19 MED ORDER — FENTANYL CITRATE (PF) 100 MCG/2ML IJ SOLN
INTRAMUSCULAR | Status: DC | PRN
  Administered 2020-04-19: 100 ug via INTRAVENOUS

## 2020-04-19 MED ORDER — LACTATED RINGERS IV SOLN: INTRAVENOUS | Status: DC

## 2020-04-19 MED ORDER — GLYCOPYRROLATE PF 0.2 MG/ML IJ SOSY
PREFILLED_SYRINGE | INTRAMUSCULAR | Status: DC | PRN
  Administered 2020-04-19: .2 mg via INTRAVENOUS

## 2020-04-19 MED ORDER — DEXAMETHASONE SODIUM PHOSPHATE 10 MG/ML IJ SOLN
INTRAMUSCULAR | Status: AC
  Filled 2020-04-19: qty 1

## 2020-04-19 MED ORDER — OXYCODONE HCL 5 MG PO TABS
5.0000 mg | ORAL_TABLET | ORAL | Status: DC | PRN
  Filled 2020-04-19: qty 2

## 2020-04-19 MED ORDER — KETOROLAC TROMETHAMINE 30 MG/ML IJ SOLN: 30.0000 mg | Freq: Once | INTRAMUSCULAR | Status: DC | PRN

## 2020-04-19 MED ORDER — ROCURONIUM BROMIDE 10 MG/ML (PF) SYRINGE
PREFILLED_SYRINGE | INTRAVENOUS | Status: DC | PRN
  Administered 2020-04-19: 60 mg via INTRAVENOUS

## 2020-04-19 MED ORDER — 0.9 % SODIUM CHLORIDE (POUR BTL) OPTIME
TOPICAL | Status: DC | PRN
  Administered 2020-04-19: 1000 mL

## 2020-04-19 SURGICAL SUPPLY — 48 items
APL PRP STRL LF DISP 70% ISPRP (MISCELLANEOUS) ×1
APL SKNCLS STERI-STRIP NONHPOA (GAUZE/BANDAGES/DRESSINGS) ×1
APPLIER CLIP ROT 10 11.4 M/L (STAPLE)
APR CLP MED LRG 11.4X10 (STAPLE)
BAG SPEC RTRVL 10 TROC 200 (ENDOMECHANICALS) ×1
BENZOIN TINCTURE PRP APPL 2/3 (GAUZE/BANDAGES/DRESSINGS) ×3 IMPLANT
BNDG ADH 1X3 SHEER STRL LF (GAUZE/BANDAGES/DRESSINGS) ×6 IMPLANT
BNDG ADH THN 3X1 STRL LF (GAUZE/BANDAGES/DRESSINGS) ×1
CABLE HIGH FREQUENCY MONO STRZ (ELECTRODE) ×3 IMPLANT
CATH CHOLANG 76X19 KUMAR (CATHETERS) ×3 IMPLANT
CHLORAPREP W/TINT 26 (MISCELLANEOUS) ×3 IMPLANT
CLIP APPLIE ROT 10 11.4 M/L (STAPLE) IMPLANT
CLIP VESOLOCK LG 6/CT PURPLE (CLIP) IMPLANT
CLIP VESOLOCK MED LG 6/CT (CLIP) ×3 IMPLANT
CLOSURE WOUND 1/2 X4 (GAUZE/BANDAGES/DRESSINGS) ×1
CLOSURE WOUND 1/4X4 (GAUZE/BANDAGES/DRESSINGS) ×1
COVER MAYO STAND STRL (DRAPES) ×3 IMPLANT
COVER SURGICAL LIGHT HANDLE (MISCELLANEOUS) ×3 IMPLANT
COVER WAND RF STERILE (DRAPES) IMPLANT
DECANTER SPIKE VIAL GLASS SM (MISCELLANEOUS) ×3 IMPLANT
DRAIN CHANNEL 19F RND (DRAIN) IMPLANT
DRAPE C-ARM 42X120 X-RAY (DRAPES) IMPLANT
EVACUATOR SILICONE 100CC (DRAIN) IMPLANT
GLOVE BIOGEL PI IND STRL 7.0 (GLOVE) ×1 IMPLANT
GLOVE BIOGEL PI INDICATOR 7.0 (GLOVE) ×2
GLOVE SURG SS PI 7.0 STRL IVOR (GLOVE) ×3 IMPLANT
GOWN STRL REUS W/TWL LRG LVL3 (GOWN DISPOSABLE) ×3 IMPLANT
GOWN STRL REUS W/TWL XL LVL3 (GOWN DISPOSABLE) ×6 IMPLANT
GRASPER SUT TROCAR 14GX15 (MISCELLANEOUS) IMPLANT
KIT BASIN (CUSTOM PROCEDURE TRAY) ×3 IMPLANT
KIT TURNOVER KIT A (KITS) IMPLANT
POUCH RETRIEVAL ECOSAC 10 (ENDOMECHANICALS) ×1 IMPLANT
POUCH RETRIEVAL ECOSAC 10MM (ENDOMECHANICALS) ×3
SCISSORS LAP 5X35 DISP (ENDOMECHANICALS) ×3 IMPLANT
SET IRRIG TUBING LAPAROSCOPIC (IRRIGATION / IRRIGATOR) ×3 IMPLANT
SET TUBE SMOKE EVAC HIGH FLOW (TUBING) ×3 IMPLANT
SLEEVE XCEL OPT CAN 5 100 (ENDOMECHANICALS) ×6 IMPLANT
STOPCOCK 4 WAY LG BORE MALE ST (IV SETS) IMPLANT
STRIP CLOSURE SKIN 1/2X4 (GAUZE/BANDAGES/DRESSINGS) ×2 IMPLANT
STRIP CLOSURE SKIN 1/4X4 (GAUZE/BANDAGES/DRESSINGS) ×1 IMPLANT
SUT ETHILON 2 0 PS N (SUTURE) IMPLANT
SUT MNCRL AB 4-0 PS2 18 (SUTURE) ×3 IMPLANT
SUT VICRYL 0 ENDOLOOP (SUTURE) IMPLANT
TOWEL OR 17X26 10 PK STRL BLUE (TOWEL DISPOSABLE) ×3 IMPLANT
TOWEL OR NON WOVEN STRL DISP B (DISPOSABLE) IMPLANT
TRAY LAPAROSCOPIC (CUSTOM PROCEDURE TRAY) ×3 IMPLANT
TROCAR BLADELESS OPT 5 100 (ENDOMECHANICALS) ×3 IMPLANT
TROCAR XCEL NON-BLD 11X100MML (ENDOMECHANICALS) ×3 IMPLANT

## 2020-04-19 NOTE — H&P (Signed)
Kimberly Wall 05-Feb-1961  [id number].    Chief Complaint/Reason for Consult: acute cholecystitis  HPI:  This is a pleasant 59 yo white female with a history of HTN and hypothyroidism.  She ate supper last night and then around 2000 developed pain across her "bra strap."  By 2200 this had significantly worsened.  She developed some nausea, but no emesis, no fevers, chills, CP, or SOB.  Nothing helped her pain.  This continued to worsen and she presented to [facility] ED where she had a work up that revealed normal labs, but an [place] that reveals cholecystitis with a 1cm thick gb wall.  She was transferred to [facility] for [place] to evaluate and proceed with surgery.  ROS: ROS: Please see HPI otherwise all other systems have been reviewed and are negative.  Family History  Problem Relation Age of Onset  . Arthritis Mother   . Cancer Mother        glioblastoma  . Varicose Veins Mother   . Arthritis Father   . Diabetes Father   . Hearing loss Father   . Heart disease Father   . Hypertension Father   . Vision loss Father   . Cancer Maternal Grandmother        throat  . Varicose Veins Maternal Grandmother   . Stroke Maternal Grandfather   . Stroke Paternal Grandfather   . Thyroid disease Sister        thyroidectomy  . Cancer Maternal Aunt        breast  . Kidney disease Paternal Grandmother   . Breast cancer Cousin        mat cousin, daughter of aunt with breast cancer, dx in her 66s  . Breast cancer Cousin        mat first cousin, daughter of aunt without cancer, dx in her 40s  . Colon cancer Cousin 77       mat cousin; son of mat uncle    Past Medical History:  Diagnosis Date  . Allergy    seasonal  . Arthritis    HANDS  . Complication of anesthesia   . Family history of cancer   . Hypertension   . Ovarian cyst    Sounds like this is a misinterpretation and she is referring to her fibroid  . PONV (postoperative nausea and vomiting)   . Thyroid disease    hypothyroidism    Past  Surgical History:  Procedure Laterality Date  . PILONIDAL CYST EXCISION  1987  . ROBOTIC ASSISTED TOTAL HYSTERECTOMY WITH BILATERAL SALPINGO OOPHERECTOMY Bilateral 01/31/2018   Procedure: XI ROBOTIC ASSISTED TOTAL HYSTERECTOMY (FOR UTERUS GREATER THAN  250 GRAMS)WITH BILATERAL SALPINGO OOPHORECTOMY;  Surgeon: Everitt Amber, MD;  Location: [facility];  Service: Gynecology;  Laterality: Bilateral;    Social History:  reports that she has never smoked. She has never used smokeless tobacco. She reports current alcohol use. She reports that she does not use drugs.  Allergies: No Known Allergies  Medications Prior to Admission  Medication Sig Dispense Refill  . acetaminophen (TYLENOL) 325 MG tablet Take 650 mg by mouth every 4 (four) hours as needed for moderate pain or headache.    . enalapril (VASOTEC) 10 MG tablet Take 1 tablet (10 mg total) by mouth daily. 90 tablet 1  . hydrochlorothiazide (HYDRODIURIL) 25 MG tablet Take 1 tablet (25 mg total) by mouth daily. 90 tablet 1  . levothyroxine (SYNTHROID) 50 MCG tablet Take 1 tablet (50 mcg total) by mouth  daily. 90 tablet 3     Physical Exam: Blood pressure (!) 169/98, pulse 63, temperature 98.9 F (37.2 C), temperature source Oral, resp. rate 16, weight 103.2 kg, last menstrual period 11/13/2017, SpO2 97 %. General: pleasant, WD, WN, obese white female who is laying in bed in NAD HEENT: head is normocephalic, atraumatic.  Sclera are noninjected.  PERRL.  Ears and nose without any masses or lesions.  Mouth is pink and moist Heart: regular, rate, and rhythm.  Normal s1,s2. No obvious murmurs, gallops, or rubs noted.  Palpable radial and pedal pulses bilaterally Lungs: CTAB, no wheezes, rhonchi, or rales noted.  Respiratory effort nonlabored Abd: soft, tender in RUQ, ND, +BS, no masses, hernias, or organomegaly MS: all 4 extremities are symmetrical with no cyanosis, clubbing, or edema. Skin: warm and dry with no masses, lesions, or rashes Neuro:  Cranial nerves 2-12 grossly intact, sensation is normal throughout Psych: A&Ox3 with an appropriate affect.   Results for orders placed or performed during the hospital encounter of 04/19/20 (from the past 48 hour(s))  CBC with Differential     Status: None   Collection Time: 04/19/20  2:50 AM  Result Value Ref Range   WBC 7.7 4.0 - 10.5 K/uL   RBC 4.94 3.87 - 5.11 MIL/uL   Hemoglobin 15.0 12.0 - 15.0 g/dL   HCT 45.4 36 - 46 %   MCV 91.9 80.0 - 100.0 fL   MCH 30.4 26.0 - 34.0 pg   MCHC 33.0 30.0 - 36.0 g/dL   RDW 12.6 11.5 - 15.5 %   Platelets 219 150 - 400 K/uL   nRBC 0.0 0.0 - 0.2 %   Neutrophils Relative % 60 %   Neutro Abs 4.7 1.7 - 7.7 K/uL   Lymphocytes Relative 29 %   Lymphs Abs 2.3 0.7 - 4.0 K/uL   Monocytes Relative 7 %   Monocytes Absolute 0.5 0 - 1 K/uL   Eosinophils Relative 3 %   Eosinophils Absolute 0.2 0 - 0 K/uL   Basophils Relative 1 %   Basophils Absolute 0.1 0 - 0 K/uL   Immature Granulocytes 0 %   Abs Immature Granulocytes 0.02 0.00 - 0.07 K/uL    Comment: Performed at [facility], [address] [place], [place] [address]  Comprehensive metabolic panel     Status: Abnormal   Collection Time: 04/19/20  2:50 AM  Result Value Ref Range   Sodium 139 135 - 145 mmol/L   Potassium 3.2 (L) 3.5 - 5.1 mmol/L   Chloride 104 98 - 111 mmol/L   CO2 25 22 - 32 mmol/L   Glucose, Bld 124 (H) 70 - 99 mg/dL    Comment: Glucose reference range applies only to samples taken after fasting for at least 8 hours.   BUN 16 6 - 20 mg/dL   Creatinine, Ser 0.65 0.44 - 1.00 mg/dL   Calcium 9.1 8.9 - 10.3 mg/dL   Total Protein 7.5 6.5 - 8.1 g/dL   Albumin 3.8 3.5 - 5.0 g/dL   AST 24 15 - 41 U/L   ALT 26 0 - 44 U/L   Alkaline Phosphatase 69 38 - 126 U/L   Total Bilirubin 0.5 0.3 - 1.2 mg/dL   GFR calc non Af Amer >60 >60 mL/min   GFR calc Af Amer >60 >60 mL/min   Anion gap 10 5 - 15    Comment: Performed at [facility], [address] [place], [place]  [address]  Lipase, blood     Status: None   Collection Time: 04/19/20  2:50 AM  Result Value Ref Range   Lipase 27 11 - 51 U/L    Comment: Performed at [facility], [address] [place], [place] [address]  Troponin I (High Sensitivity)     Status: None   Collection Time: 04/19/20  2:50 AM  Result Value Ref Range   Troponin I (High Sensitivity) 3 <18 ng/L    Comment: (NOTE) Elevated high sensitivity troponin I (hsTnI) values and significant  changes across serial measurements may suggest ACS but many other  chronic and acute conditions are known to elevate hsTnI results.  Refer to the "Links" section for chest pain algorithms and additional  guidance. Performed at [facility], [address] [place], [place] [address]   D-dimer, quantitative (not at [facility])     Status: Abnormal   Collection Time: 04/19/20  2:50 AM  Result Value Ref Range   D-Dimer, Quant 0.81 (H) 0.00 - 0.50 ug/mL-FEU    Comment: (NOTE) At the manufacturer cut-off of 0.50 ug/mL FEU, this assay has been documented to exclude PE with a sensitivity and negative predictive value of 97 to 99%.  At this time, this assay has not been approved by the FDA to exclude DVT/VTE. Results should be correlated with clinical presentation. Performed at [facility], [address] [place], [place] [address]   Troponin I (High Sensitivity)     Status: None   Collection Time: 04/19/20  5:29 AM  Result Value Ref Range   Troponin I (High Sensitivity) 4 <18 ng/L    Comment: (NOTE) Elevated high sensitivity troponin I (hsTnI) values and significant  changes across serial measurements may suggest ACS but many other  chronic and acute conditions are known to elevate hsTnI results.  Refer to the "Links" section for chest pain algorithms and additional  guidance. Performed at [facility], [address] [place], [place] [address]   Urinalysis, Routine w reflex microscopic      Status: Abnormal   Collection Time: 04/19/20  5:52 AM  Result Value Ref Range   Color, Urine YELLOW YELLOW   APPearance CLEAR CLEAR   Specific Gravity, Urine >1.030 (H) 1.005 - 1.030   pH 5.5 5.0 - 8.0   Glucose, UA NEGATIVE NEGATIVE mg/dL   Hgb urine dipstick SMALL (A) NEGATIVE   Bilirubin Urine NEGATIVE NEGATIVE   Ketones, ur NEGATIVE NEGATIVE mg/dL   Protein, ur NEGATIVE NEGATIVE mg/dL   Nitrite NEGATIVE NEGATIVE   Leukocytes,Ua NEGATIVE NEGATIVE    Comment: Performed at [facility], [address] [place], [place] [address]  Urinalysis, Microscopic (reflex)     Status: Abnormal   Collection Time: 04/19/20  5:52 AM  Result Value Ref Range   RBC / HPF 0-5 0 - 5 RBC/hpf   WBC, UA 0-5 0 - 5 WBC/hpf   Bacteria, UA RARE (A) NONE SEEN   Squamous Epithelial / LPF 0-5 0 - 5    Comment: Performed at [facility], [address] [place], [place] [address]  SARS Coronavirus 2 by RT PCR (hospital order, performed in [place] hospital lab) Nasopharyngeal Nasopharyngeal Swab     Status: None   Collection Time: 04/19/20  9:45 AM   Specimen: Nasopharyngeal Swab  Result Value Ref Range   SARS Coronavirus 2 NEGATIVE NEGATIVE    Comment: (NOTE) SARS-CoV-2  target nucleic acids are NOT DETECTED.  The SARS-CoV-2 RNA is generally detectable in upper and lower respiratory specimens during the acute phase of infection. The lowest concentration of SARS-CoV-2 viral copies this assay can detect is 250 copies / mL. A negative result does not preclude SARS-CoV-2 infection and should not be used as the sole basis for treatment or other patient management decisions.  A negative result may occur with improper specimen collection / handling, submission of specimen other than nasopharyngeal swab, presence of viral mutation(s) within the areas targeted by this assay, and inadequate number of viral copies (<250 copies / mL). A negative result must be combined with  clinical observations, patient history, and epidemiological information.  Fact Sheet for Patients:   StrictlyIdeas.no  Fact Sheet for Healthcare Providers: BankingDealers.co.za  This test is not yet approved or  cleared by the [place] FDA and has been authorized for detection and/or diagnosis of SARS-CoV-2 by FDA under an Emergency Use Authorization (EUA).  This EUA will remain in effect (meaning this test can be used) for the duration of the COVID-19 declaration under Section 564(b)(1) of the Act, 21 U.S.C. section 360bbb-3(b)(1), unless the authorization is terminated or revoked sooner.  Performed at [facility], [address] [place], [place] [address]    DG Chest 2 View  Result Date: 04/19/2020 CLINICAL DATA:  59 year old female with chest and back pain, progressive since yesterday evening. Hypertensive at home, 191 systolic. EXAM: CHEST - 2 VIEW COMPARISON:  Left rib radiographs 04/17/2009. FINDINGS: Lung volumes and mediastinal contours are normal. Visualized tracheal air column is within normal limits. Both lungs appear clear. No pneumothorax or pleural effusion. No acute osseous abnormality identified. Negative visible bowel gas pattern. IMPRESSION: No acute cardiopulmonary abnormality. Electronically Signed   By: Genevie Ann M.D.   On: 04/19/2020 03:02   CT Angio Chest PE W and/or Wo Contrast  Result Date: 04/19/2020 CLINICAL DATA:  Back pain and shortness of breath since yesterday. EXAM: CT ANGIOGRAPHY CHEST WITH CONTRAST TECHNIQUE: Multidetector CT imaging of the chest was performed using the standard protocol during bolus administration of intravenous contrast. Multiplanar CT image reconstructions and MIPs were obtained to evaluate the vascular anatomy. CONTRAST:  28mL OMNIPAQUE IOHEXOL 350 MG/ML SOLN COMPARISON:  None. FINDINGS: Cardiovascular: The heart is normal in size. No pericardial effusion. The aorta is  normal in caliber. Minimal atherosclerotic calcification at the aortic arch. The branch vessels are patent. No definite coronary artery calcifications. The pulmonary arterial tree is well opacified. No filling defects to suggest pulmonary embolism. Mediastinum/Nodes: No mediastinal or hilar mass or adenopathy. The esophagus is grossly normal. Lungs/Pleura: No acute pulmonary findings. No infiltrates, edema or effusions. No worrisome pulmonary lesions. Upper Abdomen: Numerous small layering gallstones are noted the gallbladder. The gallbladder is incompletely visualized but there appears to be gallbladder wall thickening and possible pericholecystic fluid. Recommend correlation with any right upper quadrant abdominal pain. Right upper quadrant ultrasound may be helpful to exclude acute cholecystitis. Musculoskeletal: No significant bony findings. Review of the MIP images confirms the above findings. IMPRESSION: 1. No CT findings for pulmonary embolism. 2. No aortic aneurysm or dissection. 3. No acute pulmonary findings. 4. Cholelithiasis with suspected gallbladder wall thickening and possible pericholecystic fluid. Recommend correlation with any right upper quadrant abdominal pain. Right upper quadrant ultrasound may be helpful to exclude acute cholecystitis. 5. Aortic atherosclerosis. Aortic Atherosclerosis (ICD10-I70.0). Aortic Atherosclerosis (ICD10-I70.0). Electronically Signed   By: Marijo Sanes M.D.   On: 04/19/2020 06:10  US Abdomen Limited  Result Date: 04/19/2020 CLINICAL DATA:  Right upper quadrant pain. EXAM: ULTRASOUND ABDOMEN LIMITED RIGHT UPPER QUADRANT COMPARISON:  Chest CTA 04/19/2020 FINDINGS: Gallbladder: Multiple gallstones measuring up to 1 cm. Gallbladder wall thickening measuring 1 cm. A positive sonographic Percell Miller sign was noted by sonographer. Common bile duct: Diameter: 2 mm Liver: There is diffusely increased parenchymal echogenicity without focal lesion identified. Portal vein is  patent on color Doppler imaging with normal direction of blood flow towards the liver. Other: None. IMPRESSION: 1. Gallstones with gallbladder wall thickening and positive sonographic Murphy sign concerning for acute cholecystitis. 2. Hepatic steatosis. Electronically Signed   By: Logan Bores M.D.   On: 04/19/2020 08:41      Assessment/Plan HTN - likely resume home meds post op Hypothyroidism - resume home meds post op  Cholecystitis -to OR now for lap chole -NPO preop -imaging, labs, and chart have been reviewed -I have explained the procedure, risks, and aftercare of cholecystectomy.  Risks include but are not limited to bleeding, infection, wound problems, anesthesia, diarrhea, bile leak, injury to common bile duct/liver/intestine.  She seems to understand and agrees to proceed.  FEN - NPO VTE - to start post op ID - [email or web address] Admit - obs  Henreitta Cea, [facility] 04/19/2020, 3:28 PM Please see Amion for pager number during day hours 7:00am-4:30pm or 7:00am -11:30am on weekends

## 2020-04-19 NOTE — Op Note (Signed)
PATIENT:  Kimberly Wall  59 y.o. female  PRE-OPERATIVE DIAGNOSIS:  cholecystitis  POST-OPERATIVE DIAGNOSIS:  cholecystitis  PROCEDURE:  Procedure(s): LAPAROSCOPIC CHOLECYSTECTOMY   SURGEON:  Surgeon(s): Baneen Wieseler, Arta Bruce, MD  ASSISTANT: none  ANESTHESIA:   local and general  Indications for procedure: [name] is a 59 y.o. female with symptoms of Abdominal pain and Nausea and vomiting consistent with gallbladder disease, Confirmed by Ultrasound.  Description of procedure: The patient was brought into the operative suite, placed supine. Anesthesia was administered with endotracheal tube. Patient was strapped in place and foot board was secured. All pressure points were offloaded by foam padding. The patient was prepped and draped in the usual sterile fashion.  A small incision was made to the right of the umbilicus. A 10mm trocar was inserted into the peritoneal cavity with optical entry. Pneumoperitoneum was applied with high flow low pressure. 2 83mm trocars were placed in the RUQ. A 44mm trocar was placed in the subxiphoid space. Marcaine was infused to the subxiphoid space and lateral upper right abdomen in the transversus abdominis plane. Next the patient was placed in reverse trendelenberg. The gallbladder was distended and edematous.  The gallbladder was retracted cephalad and lateral. The peritoneum was reflected off the infundibulum working lateral to medial. The cystic duct and cystic artery were identified and further dissection revealed a critical view. The cystic duct and cystic artery were doubly clipped and ligated.   The gallbladder was removed off the liver bed with cautery. The Gallbladder was placed in a specimen bag. The gallbladder fossa was irrigated and hemostasis was applied with cautery. The gallbladder was removed via the 75mm trocar. No dilation was required for removal, therefore no fascial closure was performed. Pneumoperitoneum was removed, all trocar were  removed. All incisions were closed with 4-0 monocryl subcuticular stitch. The patient woke from anesthesia and was brought to PACU in stable condition. All counts were correct  Findings: acute cholecystitis  Specimen: gallbladder  Blood loss: 20 ml  Local anesthesia: 40 ml marcaine  Complications: none  PLAN OF CARE: Admit for overnight observation  PATIENT DISPOSITION:  PACU - hemodynamically stable.  Gurney Maxin, M.D. General, Bariatric, & Minimally Invasive Surgery [facility]

## 2020-04-19 NOTE — Anesthesia Procedure Notes (Signed)
Procedure Name: Intubation Date/Time: 04/19/2020 4:05 PM Performed by: Niel Hummer, CRNA Pre-anesthesia Checklist: Patient identified, Emergency Drugs available, Suction available and Patient being monitored Patient Re-evaluated:Patient Re-evaluated prior to induction Oxygen Delivery Method: Circle system utilized Preoxygenation: Pre-oxygenation with 100% oxygen Induction Type: IV induction Ventilation: Mask ventilation without difficulty and Oral airway inserted - appropriate to patient size Laryngoscope Size: Mac and 4 Grade View: Grade I Tube type: Oral Tube size: 7.0 mm Number of attempts: 1 Airway Equipment and Method: Stylet Placement Confirmation: ETT inserted through vocal cords under direct vision,  positive ETCO2 and breath sounds checked- equal and bilateral Secured at: 22 cm Tube secured with: Tape Dental Injury: Teeth and Oropharynx as per pre-operative assessment

## 2020-04-19 NOTE — ED Triage Notes (Signed)
Pt reports headache, back pain and nausea progressively worse since dinner time. States hypertensive at home 201/143.

## 2020-04-19 NOTE — ED Provider Notes (Signed)
[facility] EMERGENCY DEPARTMENT Provider Note   CSN: [id number] Arrival date & time: 04/19/20  0040     History Chief Complaint  Patient presents with  . Headache  . Hypertension    201/143 at home    Kimberly Wall is a 59 y.o. female.  HPI     This is a 59 year old female with a history of hypertension who presents with back pain and high blood pressure at home.  Patient reports that she had progressively worsening back pain and nausea at home.  She states that her symptoms started after dinner.  She ate green beans and a grilled cheese sandwich.  She has never had pain like that.  She states that nothing seems to make it better or worse.  She states that she also developed a headache after her pain started and noted that her blood pressure was 201/143.  She reports compliance with her blood pressure medications.  Patient reports nausea without vomiting.  No diarrhea.  No chest pain or history of blood clots.  Denies recent travel or leg swelling.  Patient denies any abdominal pain or fevers.  She currently rates her pain at 10 out of 10.  She did not take anything for the pain.  She is standing up in the room because "I am just too uncomfortable sitting down."  Denies lateralizing symptoms.  Denies history of urinary tract infection or kidney stones.  Past Medical History:  Diagnosis Date  . Allergy    seasonal  . Arthritis    HANDS  . Complication of anesthesia   . Family history of cancer   . Hypertension   . Ovarian cyst    Sounds like this is a misinterpretation and she is referring to her fibroid  . PONV (postoperative nausea and vomiting)   . Thyroid disease    hypothyroidism    Patient Active Problem List   Diagnosis Date Noted  . Class 3 obesity 12/28/2019  . Genetic testing 01/21/2018  . Family history of breast cancer   . Family history of colon cancer   . Family history of brain cancer   . Subserous leiomyoma of uterus 01/09/2018  . Fibroid  12/13/2017  . Complete uterovaginal prolapse 12/13/2017  . Essential hypertension 10/15/2017    Past Surgical History:  Procedure Laterality Date  . PILONIDAL CYST EXCISION  1987  . ROBOTIC ASSISTED TOTAL HYSTERECTOMY WITH BILATERAL SALPINGO OOPHERECTOMY Bilateral 01/31/2018   Procedure: XI ROBOTIC ASSISTED TOTAL HYSTERECTOMY (FOR UTERUS GREATER THAN  250 GRAMS)WITH BILATERAL SALPINGO OOPHORECTOMY;  Surgeon: Everitt Amber, MD;  Location: [facility];  Service: Gynecology;  Laterality: Bilateral;     OB History    Gravida  3   Para  3   Term  0   Preterm  0   AB  0   Living  3     SAB  0   TAB  0   Ectopic  0   Multiple  0   Live Births  0           Family History  Problem Relation Age of Onset  . Arthritis Mother   . Cancer Mother        glioblastoma  . Varicose Veins Mother   . Arthritis Father   . Diabetes Father   . Hearing loss Father   . Heart disease Father   . Hypertension Father   . Vision loss Father   . Cancer Maternal Grandmother  throat  . Varicose Veins Maternal Grandmother   . Stroke Maternal Grandfather   . Stroke Paternal Grandfather   . Thyroid disease Sister        thyroidectomy  . Cancer Maternal Aunt        breast  . Kidney disease Paternal Grandmother   . Breast cancer Cousin        mat cousin, daughter of aunt with breast cancer, dx in her 60s  . Breast cancer Cousin        mat first cousin, daughter of aunt without cancer, dx in her 54s  . Colon cancer Cousin 53       mat cousin; son of mat uncle    Social History   Tobacco Use  . Smoking status: Never Smoker  . Smokeless tobacco: Never Used  Vaping Use  . Vaping Use: Never used  Substance Use Topics  . Alcohol use: Yes    Comment: 1/month  . Drug use: No    Home Medications Prior to Admission medications   Medication Sig Start Date End Date Taking? Authorizing Provider  acetaminophen (TYLENOL) 325 MG tablet Take 650 mg by mouth every 4 (four) hours as needed  for moderate pain or headache.    [provider]  enalapril (VASOTEC) 10 MG tablet Take 1 tablet (10 mg total) by mouth daily. 12/08/19   [place], Modena Nunnery, MD  hydrochlorothiazide (HYDRODIURIL) 25 MG tablet Take 1 tablet (25 mg total) by mouth daily. 12/08/19   Alycia Rossetti, MD  HYDROcodone-acetaminophen (NORCO/VICODIN) 5-325 MG tablet Take 1 tablet by mouth every 6 (six) hours as needed. 04/19/20   Mikailah Morel, Barbette Hair, MD  levothyroxine (SYNTHROID) 50 MCG tablet Take 1 tablet (50 mcg total) by mouth daily. 12/31/19   Alycia Rossetti, MD    Allergies    Patient has no known allergies.  Review of Systems   Review of Systems  Constitutional: Negative for fever.  Respiratory: Negative for cough and shortness of breath.   Cardiovascular: Negative for chest pain.  Gastrointestinal: Positive for nausea. Negative for abdominal pain, diarrhea and vomiting.  Genitourinary: Negative for dysuria.  Musculoskeletal: Positive for back pain.  Neurological: Positive for headaches. Negative for dizziness and weakness.  All other systems reviewed and are negative.   Physical Exam Updated Vital Signs BP (!) 155/90   Pulse 62   Temp 97.8 F (36.6 C) (Oral)   Resp 17   Wt 103.2 kg   LMP 11/13/2017   SpO2 97%   BMI 40.97 kg/m   Physical Exam Vitals and nursing note reviewed.  Constitutional:      Appearance: She is well-developed. She is obese.     Comments: Uncomfortable appearing, standing in the room  HENT:     Head: Normocephalic and atraumatic.     Mouth/Throat:     Mouth: Mucous membranes are moist.  Eyes:     Pupils: Pupils are equal, round, and reactive to light.  Cardiovascular:     Rate and Rhythm: Normal rate and regular rhythm.     Heart sounds: Normal heart sounds.  Pulmonary:     Effort: Pulmonary effort is normal. No respiratory distress.     Breath sounds: No wheezing.  Chest:     Chest wall: No tenderness.  Abdominal:     General: Bowel sounds are normal.      Palpations: Abdomen is soft.     Tenderness: There is no abdominal tenderness.  Musculoskeletal:     Cervical back:  Neck supple.     Comments: No tenderness along the thoracic back  Skin:    General: Skin is warm and dry.  Neurological:     Mental Status: She is alert and oriented to person, place, and time.     Comments: Fluent speech, cranial nerves II through XII intact  Psychiatric:        Mood and Affect: Mood normal.     ED Results / Procedures / Treatments   Labs (all labs ordered are listed, but only abnormal results are displayed) Labs Reviewed  COMPREHENSIVE METABOLIC PANEL - Abnormal; Notable for the following components:      Result Value   Potassium 3.2 (*)    Glucose, Bld 124 (*)    All other components within normal limits  URINALYSIS, ROUTINE W REFLEX MICROSCOPIC - Abnormal; Notable for the following components:   Specific Gravity, Urine >1.030 (*)    Hgb urine dipstick SMALL (*)    All other components within normal limits  D-DIMER, QUANTITATIVE (NOT AT [facility]) - Abnormal; Notable for the following components:   D-Dimer, Quant 0.81 (*)    All other components within normal limits  URINALYSIS, MICROSCOPIC (REFLEX) - Abnormal; Notable for the following components:   Bacteria, UA RARE (*)    All other components within normal limits  CBC WITH DIFFERENTIAL/PLATELET  LIPASE, BLOOD  TROPONIN I (HIGH SENSITIVITY)  TROPONIN I (HIGH SENSITIVITY)    EKG EKG Interpretation  Date/Time:  Monday April 19 2020 03:11:47 EDT Ventricular Rate:  61 PR Interval:    QRS Duration: 103 QT Interval:  584 QTC Calculation: 589 R Axis:   40 Text Interpretation: Sinus rhythm Nonspecific T abnormalities, diffuse leads Prolonged QT interval Confirmed by Thayer Jew [phone]) on 04/19/2020 4:44:28 AM   Radiology DG Chest 2 View  Result Date: 04/19/2020 CLINICAL DATA:  59 year old female with chest and back pain, progressive since yesterday evening. Hypertensive at home,  465 systolic. EXAM: CHEST - 2 VIEW COMPARISON:  Left rib radiographs 04/17/2009. FINDINGS: Lung volumes and mediastinal contours are normal. Visualized tracheal air column is within normal limits. Both lungs appear clear. No pneumothorax or pleural effusion. No acute osseous abnormality identified. Negative visible bowel gas pattern. IMPRESSION: No acute cardiopulmonary abnormality. Electronically Signed   By: Genevie Ann M.D.   On: 04/19/2020 03:02   CT Angio Chest PE W and/or Wo Contrast  Result Date: 04/19/2020 CLINICAL DATA:  Back pain and shortness of breath since yesterday. EXAM: CT ANGIOGRAPHY CHEST WITH CONTRAST TECHNIQUE: Multidetector CT imaging of the chest was performed using the standard protocol during bolus administration of intravenous contrast. Multiplanar CT image reconstructions and MIPs were obtained to evaluate the vascular anatomy. CONTRAST:  48mL OMNIPAQUE IOHEXOL 350 MG/ML SOLN COMPARISON:  None. FINDINGS: Cardiovascular: The heart is normal in size. No pericardial effusion. The aorta is normal in caliber. Minimal atherosclerotic calcification at the aortic arch. The branch vessels are patent. No definite coronary artery calcifications. The pulmonary arterial tree is well opacified. No filling defects to suggest pulmonary embolism. Mediastinum/Nodes: No mediastinal or hilar mass or adenopathy. The esophagus is grossly normal. Lungs/Pleura: No acute pulmonary findings. No infiltrates, edema or effusions. No worrisome pulmonary lesions. Upper Abdomen: Numerous small layering gallstones are noted the gallbladder. The gallbladder is incompletely visualized but there appears to be gallbladder wall thickening and possible pericholecystic fluid. Recommend correlation with any right upper quadrant abdominal pain. Right upper quadrant ultrasound may be helpful to exclude acute cholecystitis. Musculoskeletal: No significant bony findings. Review  of the MIP images confirms the above findings.  IMPRESSION: 1. No CT findings for pulmonary embolism. 2. No aortic aneurysm or dissection. 3. No acute pulmonary findings. 4. Cholelithiasis with suspected gallbladder wall thickening and possible pericholecystic fluid. Recommend correlation with any right upper quadrant abdominal pain. Right upper quadrant ultrasound may be helpful to exclude acute cholecystitis. 5. Aortic atherosclerosis. Aortic Atherosclerosis (ICD10-I70.0). Aortic Atherosclerosis (ICD10-I70.0). Electronically Signed   By: Marijo Sanes M.D.   On: 04/19/2020 06:10    Procedures Procedures (including critical care time)  Medications Ordered in ED Medications  morphine 4 MG/ML injection 4 mg (4 mg Intravenous Given 04/19/20 0239)  ondansetron (ZOFRAN) injection 4 mg (4 mg Intravenous Given 04/19/20 0240)  iohexol (OMNIPAQUE) 350 MG/ML injection 100 mL (77 mLs Intravenous Contrast Given 04/19/20 0531)    ED Course  I have reviewed the triage vital signs and the nursing notes.  Pertinent labs & imaging results that were available during my care of the patient were reviewed by me and considered in my medical decision making (see chart for details).    MDM Rules/Calculators/A&P                           Patient presents with acutely worsening back pain, nausea, headache with high blood pressure at home.  She is uncomfortable appearing but nontoxic.  Initial blood pressure is elevated.  Her history is not specifically consistent with any one entity.  She has no reproducible pain making musculoskeletal etiology less likely.  Considerations include but not limited to, kidney stone, UTI, pulmonary/cardiac etiology given location of the pain in the thorax, aortic pathology, reflux or GI pathology although she does not have any abdominal tenderness.  Broad work-up initiated given age and comorbidity.  EKG without acute ischemic changes.  Chest x-ray shows no evidence of pneumothorax or pneumonia.  Lab work reviewed.  No significant  leukocytosis or LFT abnormalities.  Negative lipase which argues against pancreatitis.  I did send a screening D-dimer given that the pain was in the thoracic back and could be pleural irritation.  D-dimer is elevated.  Will obtain a CT.  Have also requested a urine for the patient.  On recheck, she is much more comfortable after morphine and Zofran.  Urinalysis without evidence of UTI.  CT scan shows no evidence of PE and aorta is normal caliber.  However, it does appear that there is some thickening of the gallbladder.  Again lab work is reassuring.  I reexamined the patient and she has no abdominal tenderness and she is now comfortable.  Given that the pain started after dinner, it theoretically could be related to an inflamed gallbladder although the rest of her markers are reassuring.  Patient is comfortable with discharge with follow-up ultrasound imaging.  After history, exam, and medical workup I feel the patient has been appropriately medically screened and is safe for discharge home. Pertinent diagnoses were discussed with the patient. Patient was given return precautions.   Final Clinical Impression(s) / ED Diagnoses Final diagnoses:  Acute bilateral thoracic back pain  Essential hypertension    Rx / DC Orders ED Discharge Orders         Ordered    US Abdomen Limited RUQ/Gall Bladder     Discontinue     04/19/20 0627    HYDROcodone-acetaminophen (NORCO/VICODIN) 5-325 MG tablet  Every 6 hours PRN     Discontinue  Reprint     04/19/20 6333  Merryl Hacker, MD 04/19/20 [id number]

## 2020-04-19 NOTE — Anesthesia Preprocedure Evaluation (Addendum)
Anesthesia Evaluation  Patient identified by MRN, date of birth, ID band Patient awake    Reviewed: Allergy & Precautions, NPO status , Patient's Chart, lab work & pertinent test results  History of Anesthesia Complications (+) PONV and history of anesthetic complications (unsure of PONV history )  Airway Mallampati: I  TM Distance: >3 FB Neck ROM: Full    Dental no notable dental hx. (+) Dental Advisory Given, Teeth Intact   Pulmonary neg pulmonary ROS,    Pulmonary exam normal breath sounds clear to auscultation       Cardiovascular hypertension, Pt. on medications Normal cardiovascular exam Rhythm:Regular Rate:Normal     Neuro/Psych negative neurological ROS  negative psych ROS   GI/Hepatic Neg liver ROS, cholecystitis   Endo/Other  Hypothyroidism Morbid obesityBMI 41  Renal/GU negative Renal ROS  negative genitourinary   Musculoskeletal  (+) Arthritis , Osteoarthritis,    Abdominal (+) + obese,   Peds  Hematology negative hematology ROS (+)   Anesthesia Other Findings   Reproductive/Obstetrics negative OB ROS                            Anesthesia Physical Anesthesia Plan  ASA: III  Anesthesia Plan: General   Post-op Pain Management:    Induction: Intravenous  PONV Risk Score and Plan: 4 or greater and Ondansetron, Dexamethasone, Propofol infusion, Midazolam, Scopolamine patch - Pre-op, Diphenhydramine and Metaclopromide  Airway Management Planned: Oral ETT  Additional Equipment: None  Intra-op Plan:   Post-operative Plan: Extubation in OR  Informed Consent: I have reviewed the patients History and Physical, chart, labs and discussed the procedure including the risks, benefits and alternatives for the proposed anesthesia with the patient or authorized representative who has indicated his/her understanding and acceptance.     Dental advisory given  Plan Discussed  with: CRNA  Anesthesia Plan Comments:         Anesthesia Quick Evaluation

## 2020-04-19 NOTE — Discharge Instructions (Addendum)
[facility] LAPAROSCOPIC SURGERY: POST OP INSTRUCTIONS Always review your discharge instruction sheet given to you by the facility where your surgery was performed. IF YOU HAVE DISABILITY OR FAMILY LEAVE FORMS, YOU MUST BRING THEM TO THE OFFICE FOR PROCESSING.   DO NOT GIVE THEM TO YOUR DOCTOR.  PAIN CONTROL  1. First take acetaminophen (Tylenol) AND/or ibuprofen (Advil) to control your pain after surgery.  Follow directions on package.  Taking acetaminophen (Tylenol) and/or ibuprofen (Advil) regularly after surgery will help to control your pain and lower the amount of prescription pain medication you may need.  You should not take more than 3,000 mg (3 grams) of acetaminophen (Tylenol) in 24 hours.  You should not take ibuprofen (Advil), aleve, motrin, naprosyn or other NSAIDS if you have a history of stomach ulcers or chronic kidney disease.  2. A prescription for pain medication may be given to you upon discharge.  Take your pain medication as prescribed, if you still have uncontrolled pain after taking acetaminophen (Tylenol) or ibuprofen (Advil). 3. Use ice packs to help control pain. 4. If you need a refill on your pain medication, please contact your pharmacy.  They will contact our office to request authorization. Prescriptions will not be filled after 5pm or on week-ends.  HOME MEDICATIONS 5. Take your usually prescribed medications unless otherwise directed.  DIET 6. You should follow a light diet the first few days after arrival home.  Be sure to include lots of fluids daily. Avoid fatty, fried foods.   CONSTIPATION 7. It is common to experience some constipation after surgery and if you are taking pain medication.  Increasing fluid intake and taking a stool softener (such as Colace) will usually help or prevent this problem from occurring.  A mild laxative (Milk of Magnesia or Miralax) should be taken according to package instructions if there are no bowel  movements after 48 hours.  WOUND/INCISION CARE 8. Most patients will experience some swelling and bruising in the area of the incisions.  Ice packs will help.  Swelling and bruising can take several days to resolve.  9. Unless discharge instructions indicate otherwise, follow guidelines below  a. STERI-STRIPS - you may remove your outer bandages 48 hours after surgery, and you may shower at that time.  You have steri-strips (small skin tapes) in place directly over the incision.  These strips should be left on the skin for 7-10 days.   b. DERMABOND/SKIN GLUE - you may shower in 24 hours.  The glue will flake off over the next 2-3 weeks. 10. Any sutures or staples will be removed at the office during your follow-up visit.  ACTIVITIES 11. You may resume regular (light) daily activities beginning the next day--such as daily self-care, walking, climbing stairs--gradually increasing activities as tolerated.  You may have sexual intercourse when it is comfortable.  Refrain from any heavy lifting or straining until approved by your doctor. a. You may drive when you are no longer taking prescription pain medication, you can comfortably wear a seatbelt, and you can safely maneuver your car and apply brakes.  FOLLOW-UP 12. You should see your doctor in the office for a follow-up appointment approximately 2-3 weeks after your surgery.  You should have been given your post-op/follow-up appointment when your surgery was scheduled.  If you did not receive a post-op/follow-up appointment, make sure that you call for this appointment within a day or two after you arrive home to insure a convenient appointment time.  WHEN   TO CALL YOUR DOCTOR: 1. Fever over 101.0 2. Inability to urinate 3. Continued bleeding from incision. 4. Increased pain, redness, or drainage from the incision. 5. Increasing abdominal pain  The clinic staff is available to answer your questions during regular business hours.  Please don't  hesitate to call and ask to speak to one of the nurses for clinical concerns.  If you have a medical emergency, go to the nearest emergency room or call 911.  A surgeon from [facility] is always on call at the hospital. [address], [address], [place], [place]  [address] ? [address] [address], [place], [place]   [address] [phone] ? [phone] ? FAX [phone] Web site: [email or web address]  

## 2020-04-19 NOTE — Transfer of Care (Signed)
Immediate Anesthesia Transfer of Care Note  Patient: Kimberly Wall  Procedure(s) Performed: Procedure(s): LAPAROSCOPIC CHOLECYSTECTOMY (N/A)  Patient Location: PACU  Anesthesia Type:General  Level of Consciousness: Alert, Awake, Oriented  Airway & Oxygen Therapy: Patient Spontanous Breathing  Post-op Assessment: Report given to RN  Post vital signs: Reviewed and stable  Last Vitals:  Vitals:   04/19/20 1322 04/19/20 1442  BP: 126/82 (!) 169/98  Pulse: 64 63  Resp: 18 16  Temp: 36.6 C 37.2 C  SpO2: 84% 83%    Complications: No apparent anesthesia complications

## 2020-04-19 NOTE — Anesthesia Postprocedure Evaluation (Signed)
Anesthesia Post Note  Patient: Kimberly Wall  Procedure(s) Performed: LAPAROSCOPIC CHOLECYSTECTOMY (N/A )     Patient location during evaluation: PACU Anesthesia Type: General Level of consciousness: awake and alert, oriented and patient cooperative Pain management: pain level controlled Vital Signs Assessment: post-procedure vital signs reviewed and stable Respiratory status: spontaneous breathing, nonlabored ventilation and respiratory function stable Cardiovascular status: blood pressure returned to baseline and stable Postop Assessment: no apparent nausea or vomiting Anesthetic complications: no   No complications documented.  Last Vitals:  Vitals:   04/19/20 1715 04/19/20 1730  BP: (!) 158/92 (!) 157/94  Pulse: 66 75  Resp: 11 12  Temp:    SpO2: 95% 94%    Last Pain:  Vitals:   04/19/20 1730  TempSrc:   PainSc: [name]

## 2020-04-20 ENCOUNTER — Encounter (HOSPITAL_COMMUNITY): Payer: Self-pay | Admitting: General Surgery

## 2020-04-20 LAB — BASIC METABOLIC PANEL
Anion gap: 10 (ref 5–15)
BUN: 10 mg/dL (ref 6–20)
CO2: 25 mmol/L (ref 22–32)
Calcium: 8.7 mg/dL — ABNORMAL LOW (ref 8.9–10.3)
Chloride: 105 mmol/L (ref 98–111)
Creatinine, Ser: 0.68 mg/dL (ref 0.44–1.00)
GFR calc Af Amer: >60 mL/min (ref >60)
GFR calc non Af Amer: >60 mL/min (ref >60)
Glucose, Bld: 132 mg/dL — ABNORMAL HIGH (ref 70–99)
Potassium: 4.1 mmol/L (ref 3.5–5.1)
Sodium: 140 mmol/L (ref 135–145)

## 2020-04-20 LAB — CBC
HCT: 41.8 % (ref 36.0–46.0)
Hemoglobin: 13.8 g/dL (ref 12.0–15.0)
MCH: 30.8 pg (ref 26.0–34.0)
MCHC: 33 g/dL (ref 30.0–36.0)
MCV: 93.3 fL (ref 80.0–100.0)
Platelets: 201 10*3/uL (ref 150–400)
RBC: 4.48 MIL/uL (ref 3.87–5.11)
RDW: 12.7 % (ref 11.5–15.5)
WBC: 8.5 10*3/uL (ref 4.0–10.5)
nRBC: 0 % (ref 0.0–0.2)

## 2020-04-20 MED ORDER — ACETAMINOPHEN 325 MG PO TABS: 650.0000 mg | ORAL_TABLET | Freq: Four times a day (QID) | ORAL | Status: DC | PRN

## 2020-04-20 NOTE — Plan of Care (Signed)
  Problem: Clinical Measurements: Goal: Ability to maintain clinical measurements within normal limits will improve Outcome: Adequate for Discharge Goal: Will remain free from infection Outcome: Adequate for Discharge Goal: Diagnostic test results will improve Outcome: Adequate for Discharge Goal: Respiratory complications will improve Outcome: Adequate for Discharge Goal: Cardiovascular complication will be avoided Outcome: Adequate for Discharge   

## 2020-04-20 NOTE — Discharge Summary (Signed)
[facility] Discharge Summary   Patient ID: Kimberly Wall MRN: [id number] DOB/AGE: 04/17/1961 59 y.o.  Admit date: 04/19/2020 Discharge date: 04/20/2020  Discharge Diagnosis Patient Active Problem List   Diagnosis Date Noted  . Acute cholecystitis 04/19/2020  . Acute calculous cholecystitis 04/19/2020  . Class 3 obesity 12/28/2019  . Genetic testing 01/21/2018  . Family history of breast cancer   . Family history of colon cancer   . Family history of brain cancer   . Subserous leiomyoma of uterus 01/09/2018  . Fibroid 12/13/2017  . Complete uterovaginal prolapse 12/13/2017  . Essential hypertension 10/15/2017   Consultants N/A  Imaging: DG Chest 2 View  Result Date: 04/19/2020 CLINICAL DATA:  59 year old female with chest and back pain, progressive since yesterday evening. Hypertensive at home, 009 systolic. EXAM: CHEST - 2 VIEW COMPARISON:  Left rib radiographs 04/17/2009. FINDINGS: Lung volumes and mediastinal contours are normal. Visualized tracheal air column is within normal limits. Both lungs appear clear. No pneumothorax or pleural effusion. No acute osseous abnormality identified. Negative visible bowel gas pattern. IMPRESSION: No acute cardiopulmonary abnormality. Electronically Signed   By: Genevie Ann M.D.   On: 04/19/2020 03:02   CT Angio Chest PE W and/or Wo Contrast  Result Date: 04/19/2020 CLINICAL DATA:  Back pain and shortness of breath since yesterday. EXAM: CT ANGIOGRAPHY CHEST WITH CONTRAST TECHNIQUE: Multidetector CT imaging of the chest was performed using the standard protocol during bolus administration of intravenous contrast. Multiplanar CT image reconstructions and MIPs were obtained to evaluate the vascular anatomy. CONTRAST:  28mL OMNIPAQUE IOHEXOL 350 MG/ML SOLN COMPARISON:  None. FINDINGS: Cardiovascular: The heart is normal in size. No pericardial effusion. The aorta is normal in caliber. Minimal atherosclerotic calcification at the aortic arch.  The branch vessels are patent. No definite coronary artery calcifications. The pulmonary arterial tree is well opacified. No filling defects to suggest pulmonary embolism. Mediastinum/Nodes: No mediastinal or hilar mass or adenopathy. The esophagus is grossly normal. Lungs/Pleura: No acute pulmonary findings. No infiltrates, edema or effusions. No worrisome pulmonary lesions. Upper Abdomen: Numerous small layering gallstones are noted the gallbladder. The gallbladder is incompletely visualized but there appears to be gallbladder wall thickening and possible pericholecystic fluid. Recommend correlation with any right upper quadrant abdominal pain. Right upper quadrant ultrasound may be helpful to exclude acute cholecystitis. Musculoskeletal: No significant bony findings. Review of the MIP images confirms the above findings. IMPRESSION: 1. No CT findings for pulmonary embolism. 2. No aortic aneurysm or dissection. 3. No acute pulmonary findings. 4. Cholelithiasis with suspected gallbladder wall thickening and possible pericholecystic fluid. Recommend correlation with any right upper quadrant abdominal pain. Right upper quadrant ultrasound may be helpful to exclude acute cholecystitis. 5. Aortic atherosclerosis. Aortic Atherosclerosis (ICD10-I70.0). Aortic Atherosclerosis (ICD10-I70.0). Electronically Signed   By: Marijo Sanes M.D.   On: 04/19/2020 06:10   US Abdomen Limited  Result Date: 04/19/2020 CLINICAL DATA:  Right upper quadrant pain. EXAM: ULTRASOUND ABDOMEN LIMITED RIGHT UPPER QUADRANT COMPARISON:  Chest CTA 04/19/2020 FINDINGS: Gallbladder: Multiple gallstones measuring up to 1 cm. Gallbladder wall thickening measuring 1 cm. A positive sonographic Percell Miller sign was noted by sonographer. Common bile duct: Diameter: 2 mm Liver: There is diffusely increased parenchymal echogenicity without focal lesion identified. Portal vein is patent on color Doppler imaging with normal direction of blood flow towards the  liver. Other: None. IMPRESSION: 1. Gallstones with gallbladder wall thickening and positive sonographic Murphy sign concerning for acute cholecystitis. 2. Hepatic steatosis. Electronically Signed   By:  Logan Bores M.D.   On: 04/19/2020 08:41    Procedures Dr. Lurena Joiner Kinsinger (04/19/20) - Laparoscopic Cholecystectomy    Hospital Course:  59 y/o F who presented to [facility] with 24 hours of upper abdominal pain associated with nausea and vomiting.  Workup showed acute calculous cholecystitis.  Patient was admitted and underwent procedure listed above.  Tolerated procedure well and was transferred to the floor.  Diet was advanced as tolerated.  On POD#1, the patient was voiding well, tolerating diet, ambulating well, pain well controlled, vital signs stable, incisions c/d/i and felt stable for discharge home.  Patient will follow up in our office in 2 weeks and knows to call with questions or concerns.  She will call to confirm appointment date/time.    Physical Exam: General:  Alert, NAD, pleasant, comfortable Abd:  Soft, ND, mild tenderness, incisions C/D/I with steri-strips and band-aids in place   Allergies as of 04/20/2020   No Known Allergies     Medication List    TAKE these medications   acetaminophen 325 MG tablet Commonly known as: TYLENOL Take 2 tablets (650 mg total) by mouth every 6 (six) hours as needed for moderate pain or headache. What changed: when to take this   enalapril 10 MG tablet Commonly known as: VASOTEC Take 1 tablet (10 mg total) by mouth daily.   hydrochlorothiazide 25 MG tablet Commonly known as: HYDRODIURIL Take 1 tablet (25 mg total) by mouth daily.   HYDROcodone-acetaminophen 5-325 MG tablet Commonly known as: NORCO/VICODIN Take 1 tablet by mouth every 6 (six) hours as needed.   levothyroxine 50 MCG tablet Commonly known as: SYNTHROID Take 1 tablet (50 mcg total) by mouth daily.         Follow-up Information    Dwight, Modena Nunnery, MD.   Specialty:  Family Medicine Contact information: [place] [address] [facility], [place] Follow up.   Specialty: General Surgery Why: our office is scheduling you for post-operative follow up. please call to confirm appointment date/time. Contact information: [address] [place] [phone]              Signed: Obie Dredge, [facility] 04/20/2020, 11:03 AM

## 2020-04-21 LAB — SURGICAL PATHOLOGY

## 2020-05-05 ENCOUNTER — Ambulatory Visit (INDEPENDENT_AMBULATORY_CARE_PROVIDER_SITE_OTHER): Payer: No Typology Code available for payment source | Admitting: Family Medicine

## 2020-05-05 ENCOUNTER — Encounter: Payer: Self-pay | Admitting: Family Medicine

## 2020-05-05 ENCOUNTER — Other Ambulatory Visit: Payer: Self-pay

## 2020-05-05 VITALS — BP 130/68 | HR 72 | Temp 97.8°F | Resp 14 | Ht 62.5 in | Wt 222.0 lb

## 2020-05-05 DIAGNOSIS — M546 Pain in thoracic spine: Secondary | ICD-10-CM | POA: Diagnosis not present

## 2020-05-05 MED ORDER — METHOCARBAMOL 500 MG PO TABS: 500.0000 mg | ORAL_TABLET | Freq: Three times a day (TID) | ORAL | 1 refills | Status: DC | PRN

## 2020-05-05 MED ORDER — DICLOFENAC SODIUM 75 MG PO TBEC
75.0000 mg | DELAYED_RELEASE_TABLET | Freq: Two times a day (BID) | ORAL | 0 refills | Status: DC
Start: 2020-05-05 — End: 2020-06-28

## 2020-05-05 MED FILL — DICLOFENAC SOD EC 75 MG TAB: 75 | 30 days supply | Qty: 60 | Fill #0

## 2020-05-05 MED FILL — METHOCARBAMOL 500 MG TABS: 500 | 10 days supply | Qty: 30 | Fill #0

## 2020-05-05 NOTE — Progress Notes (Signed)
   Subjective:    Patient ID: Kimberly Wall, female    DOB: 10/10/1960, 59 y.o.   MRN: [id number]  Patient presents for Back Spasms (began Thursday- twisted back and has since had spasms in mid back- L>R)    Recent gallbladder removal after she presented with back pain between shoulder blades. She has been trying to be careful with lifting but  Last week was picking up grocery times out of her cart to put on the conveyor and noticed a little discomfort. Since then she feels tweaks in her back if she turns or twist a certain way and will have a spasm  She has not tried anything in particular  She has taken some tylenol    No urinary problems, no change in bowel movements     Review Of Systems:  GEN- denies fatigue, fever, weight loss,weakness, recent illness HEENT- denies eye drainage, change in vision, nasal discharge, CVS- denies chest pain, palpitations RESP- denies SOB, cough, wheeze ABD- denies N/V, change in stools, abd pain GU- denies dysuria, hematuria, dribbling, incontinence MSK- denies joint pain+, muscle aches, injury Neuro- denies headache, dizziness, syncope, seizure activity       Objective:    BP (!) 130/68   Pulse 72   Temp 97.8 F (36.6 C) (Temporal)   Resp 14   Ht 5' 2.5" (1.588 m)   Wt (!) 222 lb (100.7 kg)   LMP 11/13/2017   SpO2 98%   BMI 39.96 kg/m  GEN- NAD, alert and oriented x3 CVS- RRR, no murmur RESP-CTAB MSK- Spine NT, good ROM, TTP Left flank/latissimus region, neg SLR, fair ROM HIPS/knees Neuro- strength in tact LE, sensation in tact LE, DTR symmetric  ABD-NABS,soft,NT.ND EXT- No edema Pulses- Radial, DP- 2+        Assessment & Plan:      Problem List Items Addressed This Visit    None    Visit Diagnoses    Acute left-sided thoracic back pain    -  Primary   MSK spasm pain,, treat with robaxin, heating pad, NSAID- Diclofenac, NO IMAGING needed at this time    Relevant Medications   methocarbamol (ROBAXIN) 500 MG tablet    diclofenac (VOLTAREN) 75 MG EC tablet      Note: This dictation was prepared with Dragon dictation along with smaller phrase technology. Any transcriptional errors that result from this process are unintentional.

## 2020-05-05 NOTE — Patient Instructions (Addendum)
F/U as previous 

## 2020-05-11 ENCOUNTER — Other Ambulatory Visit: Payer: Self-pay | Admitting: Obstetrics and Gynecology

## 2020-05-11 DIAGNOSIS — Z1231 Encounter for screening mammogram for malignant neoplasm of breast: Secondary | ICD-10-CM

## 2020-05-19 ENCOUNTER — Other Ambulatory Visit: Payer: Self-pay | Admitting: *Deleted

## 2020-05-19 NOTE — Patient Outreach (Signed)
[facility]) Care Management  05/19/2020  Kimberly Wall December 29, 1960 [id number]   Transition of care telephone call  Referral received:05/19/20 Initial outreach:05/19/20 Insurance: [employer]  Initial unsuccessful telephone call to patient's preferred number in order to complete transition of care assessment; no answer, left HIPAA compliant voicemail message requesting return call.   Objective: Per the electronic medical record, Kimberly Wall  was hospitalized at [facility]  From 7/12-7/13/21  with/for Laparoscopic Cholecystectomy  Comorbidities include: Hypertension . She  was discharged to home on 04/20/20 without the need for home health services or durable medical equipment per the discharge summary.   Plan: This RNCM will route unsuccessful outreach letter with [place] Management pamphlet and 24 hour Nurse Advice Line Magnet to [place] Management clinical pool to be mailed to patient's home address. This RNCM will attempt another outreach within 4 business days.  Joylene Draft, RN, BSN  [place] Management Coordinator  [phone]- Mobile [phone]- Toll Free Main Office

## 2020-05-24 ENCOUNTER — Other Ambulatory Visit: Payer: Self-pay | Admitting: *Deleted

## 2020-05-24 NOTE — Patient Outreach (Signed)
[facility]) Care Management  05/24/2020  ANNIELEE JEMMOTT [birth year] [id number]   Transition of care call Referral received: 05/19/20 Initial outreach attempt: 05/19/20 Insurance: [employer]   2nd unsuccessful telephone call to patient's preferred contact number in order to complete post hospital discharge transition of care assessment , no answer left HIPAA compliant message requesting return call.    Objective: Per the electronic medical record, Mrs. Speranza  was hospitalized at [facility]  From 7/12-7/13/21  with/for Laparoscopic Cholecystectomy  Comorbidities include: Hypertension . She  was discharged to home on 04/20/20 without the need for home health services or durable medical equipment per the discharge summary.   Plan If no return call from patient will attempt 3rd outreach in the next 4 business days.    Joylene Draft, RN, BSN  [place] Management Coordinator  [phone]- Mobile [phone]- Toll Free Main Office

## 2020-05-27 ENCOUNTER — Other Ambulatory Visit: Payer: Self-pay | Admitting: *Deleted

## 2020-05-27 NOTE — Patient Outreach (Signed)
[facility]) Care Management  05/27/2020  Kimberly Wall 1961-04-14 [id number]   Transition of care call Referral received: 05/19/20 Initial outreach attempt: 05/19/20 Insurance: [employer]  Third unsuccessful telephone call to patient's preferred contact number in order to complete post hospital discharge transition of care assessment; no answer, left HIPAA compliant message requesting return call.   Objective: Per the electronic medical record, Kimberly Wall hospitalized at [facility] From 7/12-7/13/21with/for Laparoscopic CholecystectomyComorbidities include: Hypertension . Shewas discharged to home on 7/13/21without the need for home health services or durable medical equipment per the discharge summary.   Plan: If no return call from patient, will close case to [place] Management services in 10 business days after initial post hospital discharge outreach, on 05/19/20.  Joylene Draft, RN, BSN  [place] Management Coordinator  [phone]- Mobile [phone]- Toll Free Main Office

## 2020-06-01 ENCOUNTER — Other Ambulatory Visit: Payer: Self-pay | Admitting: *Deleted

## 2020-06-01 NOTE — Patient Outreach (Signed)
[facility]) Care Management  06/01/2020  Kimberly Wall 06/17/1961 [id number]   Transition of care /Case Closure Unsuccessful outreach    Referral received:05/19/20 Initial outreach:05/19/20 Insurance: [employer]   Unable to complete post hospital discharge transition of care assessment. No return call form patient after 3 call attempts and no response to request to contact RN Care Coordinator in unsuccessful outreach letter mailed to home on .  Objective: Per the electronic medical record, [facility]. Campwas [facility]/12-7/13/21with/for Laparoscopic CholecystectomyComorbidities include: Hypertension . Shewas discharged to home on 7/13/21without the need for home health services or durable medical equipment per the discharge summary.  Plan Case closed to [employer] as it has been 10 days since initial post discharge outreach attempt.    Joylene Draft, RN, BSN  [place] Management Coordinator  [phone]- Mobile [phone]- Toll Free Main Office

## 2020-06-15 ENCOUNTER — Ambulatory Visit
Admission: RE | Admit: 2020-06-15 | Discharge: 2020-06-15 | Disposition: A | Discharge status: Home / Self Care | Payer: No Typology Code available for payment source | Source: Ambulatory Visit | Attending: Obstetrics and Gynecology | Admitting: Obstetrics and Gynecology

## 2020-06-15 ENCOUNTER — Other Ambulatory Visit: Payer: Self-pay

## 2020-06-15 DIAGNOSIS — Z1231 Encounter for screening mammogram for malignant neoplasm of breast: Secondary | ICD-10-CM

## 2020-06-28 ENCOUNTER — Ambulatory Visit (INDEPENDENT_AMBULATORY_CARE_PROVIDER_SITE_OTHER): Payer: No Typology Code available for payment source | Admitting: Family Medicine

## 2020-06-28 ENCOUNTER — Encounter: Payer: Self-pay | Admitting: Family Medicine

## 2020-06-28 ENCOUNTER — Other Ambulatory Visit: Payer: Self-pay | Admitting: Family Medicine

## 2020-06-28 ENCOUNTER — Other Ambulatory Visit: Payer: Self-pay

## 2020-06-28 VITALS — BP 128/74 | HR 90 | Temp 97.7°F | Resp 14 | Ht 62.5 in | Wt 221.0 lb

## 2020-06-28 DIAGNOSIS — I10 Essential (primary) hypertension: Secondary | ICD-10-CM

## 2020-06-28 DIAGNOSIS — J0100 Acute maxillary sinusitis, unspecified: Secondary | ICD-10-CM

## 2020-06-28 DIAGNOSIS — E669 Obesity, unspecified: Secondary | ICD-10-CM | POA: Diagnosis not present

## 2020-06-28 DIAGNOSIS — Z23 Encounter for immunization: Secondary | ICD-10-CM | POA: Diagnosis not present

## 2020-06-28 DIAGNOSIS — E039 Hypothyroidism, unspecified: Secondary | ICD-10-CM | POA: Diagnosis not present

## 2020-06-28 MED ORDER — AZITHROMYCIN 250 MG PO TABS
ORAL_TABLET | ORAL | 0 refills | Status: DC
Start: 2020-06-28 — End: 2021-05-12

## 2020-06-28 MED ORDER — ENALAPRIL MALEATE 10 MG PO TABS: 10.0000 mg | ORAL_TABLET | Freq: Every day | ORAL | 2 refills | Status: DC

## 2020-06-28 MED ORDER — NYSTATIN 100000 UNIT/GM EX POWD: 1.0000 "application " | Freq: Three times a day (TID) | CUTANEOUS | 1 refills | Status: AC

## 2020-06-28 MED ORDER — HYDROCHLOROTHIAZIDE 25 MG PO TABS: 25.0000 mg | ORAL_TABLET | Freq: Every day | ORAL | 2 refills | Status: DC

## 2020-06-28 MED FILL — AZITHROMYCIN 250 MG TABS: 250 | 5 days supply | Qty: 6 | Fill #0

## 2020-06-28 MED FILL — ENALAPRIL MALEATE 10 MG TAB: 10 | 90 days supply | Qty: 90 | Fill #0

## 2020-06-28 MED FILL — NYSTATIN 100,000 UNIT/GM PO: 100000 | 30 days supply | Qty: 60 | Fill #0

## 2020-06-28 MED FILL — HYDROCHLOROTHIAZIDE 25 MG T: 25 | 90 days supply | Qty: 90 | Fill #0

## 2020-06-28 NOTE — Patient Instructions (Signed)
F/U 6 months for Physical  

## 2020-06-28 NOTE — Assessment & Plan Note (Signed)
Controlled no changes Check labs Improving nutriion and active with work Weight down 5lbs

## 2020-06-28 NOTE — Assessment & Plan Note (Signed)
Recheck TFT , some improvement in fatigue

## 2020-06-28 NOTE — Progress Notes (Signed)
   Subjective:    Patient ID: Kimberly Wall, female    DOB: Jan 14, 1961, 59 y.o.   MRN: [id number]  Patient presents for Follow-up (is fasting) and Allergies (naal congestion, nonproductive cough)   patient here to follow-up chronic medical problems.  Medications reviewed.   Hypothyroidism- taking levothyroxine 50cmg once a day    HTN- taking enalopril, HCTZ    Obesity- weight down 5lbs since visit in March    Mood disorder- she was referred to therapy   Sinus pressure drainage for past 3 days , no fever, no change in taste in smell   she has some wheezing when she is laying down mostly post nasal drip, does not feel tight or SOB   She has used OTC cough syrup and nasal spray OTC  No fever,   No recent travel  COVID-19 vaccine UTD   Requested refill on nystatin powder, uses for prn intertrigo   Due for flu shot   Review Of Systems:  GEN- denies fatigue, fever, weight loss,weakness, recent illness HEENT- denies eye drainage, change in vision, +nasal discharge, CVS- denies chest pain, palpitations RESP- denies SOB, +cough, +wheeze ABD- denies N/V, change in stools, abd pain GU- denies dysuria, hematuria, dribbling, incontinence MSK- denies joint pain, muscle aches, injury Neuro- denies headache, dizziness, syncope, seizure activity       Objective:    BP 128/74   Pulse 90   Temp 97.7 F (36.5 C) (Temporal)   Resp 14   Ht 5' 2.5" (1.588 m)   Wt 221 lb (100.2 kg)   LMP 11/13/2017   SpO2 94%   BMI 39.78 kg/m  GEN- NAD, alert and oriented x3,well appearing  HEENT- PERRL, EOMI, non injected sclera, pink conjunctiva, MMM, oropharynx clear, Tm clear, nares clear , minimal sinus tenderness  Neck- Supple, no thyromegaly, no LAD  CVS- RRR, no murmur RESP-CTAB ABD-NABS,soft,NT,ND EXT- No edema Pulses- Radial, DP- 2+        Assessment & Plan:      Problem List Items Addressed This Visit      Unprioritized   Class 2 obesity    Recheck lipids, continue with low  fat diet, reduced carbs       Essential hypertension - Primary    Controlled no changes Check labs Improving nutriion and active with work Weight down 5lbs       Relevant Medications   enalapril (VASOTEC) 10 MG tablet   hydrochlorothiazide (HYDRODIURIL) 25 MG tablet   Other Relevant Orders   CBC with Differential/Platelet   Comprehensive metabolic panel   Lipid panel   Hypothyroidism    Recheck TFT , some improvement in fatigue       Relevant Orders   T3, free   TSH    Other Visit Diagnoses    Need for immunization against influenza       Relevant Orders   Flu Vaccine QUAD 36+ mos IM (Completed)   Acute non-recurrent maxillary sinusitis       early symptoms, since she has had sinus infections before, given zpak, use allery med, nasal spray, if not improving in the next 2-3 days, would recommend covid 19 testing for possible breakthrough infection No SOB or wheezing on exam Normal oxygen sat    Relevant Medications   azithromycin (ZITHROMAX) 250 MG tablet      Note: This dictation was prepared with Dragon dictation along with smaller phrase technology. Any transcriptional errors that result from this process are unintentional.

## 2020-06-28 NOTE — Assessment & Plan Note (Signed)
Recheck lipids, continue with low fat diet, reduced carbs

## 2020-06-29 LAB — COMPREHENSIVE METABOLIC PANEL
AG Ratio: 1.4 (calc) (ref 1.0–2.5)
ALT: 16 U/L (ref 6–29)
AST: 15 U/L (ref 10–35)
Albumin: 3.7 g/dL (ref 3.6–5.1)
Alkaline phosphatase (APISO): 67 U/L (ref 37–153)
BUN: 10 mg/dL (ref 7–25)
CO2: 27 mmol/L (ref 20–32)
Calcium: 8.7 mg/dL (ref 8.6–10.4)
Chloride: 105 mmol/L (ref 98–110)
Creat: 0.75 mg/dL (ref 0.50–1.05)
Globulin: 2.6 g/dL (calc) (ref 1.9–3.7)
Glucose, Bld: 108 mg/dL — ABNORMAL HIGH (ref 65–99)
Potassium: 3.7 mmol/L (ref 3.5–5.3)
Sodium: 141 mmol/L (ref 135–146)
Total Bilirubin: 0.6 mg/dL (ref 0.2–1.2)
Total Protein: 6.3 g/dL (ref 6.1–8.1)

## 2020-06-29 LAB — CBC WITH DIFFERENTIAL/PLATELET
Absolute Monocytes: 544 cells/uL (ref 200–950)
Basophils Absolute: 38 cells/uL (ref 0–200)
Basophils Relative: 0.6 %
Eosinophils Absolute: 141 cells/uL (ref 15–500)
Eosinophils Relative: 2.2 %
HCT: 43.5 % (ref 35.0–45.0)
Hemoglobin: 14.5 g/dL (ref 11.7–15.5)
Lymphs Abs: 1152 cells/uL (ref 850–3900)
MCH: 31 pg (ref 27.0–33.0)
MCHC: 33.3 g/dL (ref 32.0–36.0)
MCV: 93.1 fL (ref 80.0–100.0)
MPV: 10.9 fL (ref 7.5–12.5)
Monocytes Relative: 8.5 %
Neutro Abs: 4525 cells/uL (ref 1500–7800)
Neutrophils Relative %: 70.7 %
Platelets: 173 10*3/uL (ref 140–400)
RBC: 4.67 10*6/uL (ref 3.80–5.10)
RDW: 13.1 % (ref 11.0–15.0)
Total Lymphocyte: 18 %
WBC: 6.4 10*3/uL (ref 3.8–10.8)

## 2020-06-29 LAB — LIPID PANEL
Cholesterol: 143 mg/dL (ref <200)
HDL: 40 mg/dL — ABNORMAL LOW (ref >=50)
LDL Cholesterol (Calc): 86 mg/dL (calc)
Non-HDL Cholesterol (Calc): 103 mg/dL (calc) (ref <130)
Total CHOL/HDL Ratio: 3.6 (calc) (ref <5.0)
Triglycerides: 84 mg/dL (ref <150)

## 2020-06-29 LAB — TSH: TSH: 6.06 mIU/L — ABNORMAL HIGH (ref 0.40–4.50)

## 2020-06-29 LAB — T3, FREE: T3, Free: 2.7 pg/mL (ref 2.3–4.2)

## 2020-06-30 ENCOUNTER — Other Ambulatory Visit: Payer: Self-pay | Admitting: *Deleted

## 2020-06-30 DIAGNOSIS — E039 Hypothyroidism, unspecified: Secondary | ICD-10-CM

## 2020-06-30 MED ORDER — LEVOTHYROXINE SODIUM 75 MCG PO TABS: 75.0000 ug | ORAL_TABLET | Freq: Every day | ORAL | 3 refills | Status: DC

## 2020-07-02 MED FILL — LEVOTHYROXINE 50 MCG TABLET: 50 | 90 days supply | Qty: 90 | Fill #2

## 2020-08-21 ENCOUNTER — Ambulatory Visit: Payer: No Typology Code available for payment source | Attending: Internal Medicine

## 2020-08-21 DIAGNOSIS — Z23 Encounter for immunization: Secondary | ICD-10-CM

## 2020-08-21 NOTE — Progress Notes (Signed)
   Covid-19 Vaccination Clinic  Name:  Kimberly Wall    MRN: [id number] DOB: 31-Mar-1961  08/21/2020  Kimberly Wall was observed post Covid-19 immunization for 15 minutes without incident. She was provided with Vaccine Information Sheet and instruction to access the V-Safe system.   Kimberly Wall was instructed to call 911 with any severe reactions post vaccine: Marland Kitchen Difficulty breathing  . Swelling of face and throat  . A fast heartbeat  . A bad rash all over body  . Dizziness and weakness

## 2020-08-31 ENCOUNTER — Other Ambulatory Visit: Payer: Self-pay | Admitting: *Deleted

## 2020-08-31 MED ORDER — LEVOTHYROXINE SODIUM 75 MCG PO TABS: 75.0000 ug | ORAL_TABLET | Freq: Every day | ORAL | 3 refills | Status: DC

## 2020-09-09 ENCOUNTER — Other Ambulatory Visit: Payer: Self-pay | Admitting: *Deleted

## 2020-09-09 ENCOUNTER — Other Ambulatory Visit: Payer: Self-pay | Admitting: Family Medicine

## 2020-09-09 MED ORDER — LEVOTHYROXINE SODIUM 75 MCG PO TABS: 75.0000 ug | ORAL_TABLET | Freq: Every day | ORAL | 3 refills | Status: DC

## 2020-09-09 MED FILL — LEVOTHYROXINE 75 MCG TABLET: 75 | 90 days supply | Qty: 90 | Fill #0

## 2020-10-13 MED FILL — ENALAPRIL MALEATE 10 MG TAB: 10 | 90 days supply | Qty: 90 | Fill #1

## 2020-11-01 ENCOUNTER — Encounter: Payer: Self-pay | Admitting: Family Medicine

## 2020-12-04 MED FILL — HYDROCHLOROTHIAZIDE 25 MG T: 25 | 90 days supply | Qty: 90 | Fill #1

## 2020-12-11 MED FILL — LEVOTHYROXINE 75 MCG TABLET: 75 | 90 days supply | Qty: 90 | Fill #1

## 2020-12-21 ENCOUNTER — Encounter: Payer: No Typology Code available for payment source | Admitting: Family Medicine

## 2021-01-18 ENCOUNTER — Other Ambulatory Visit (HOSPITAL_COMMUNITY): Payer: Self-pay

## 2021-01-18 MED FILL — Enalapril Maleate Tab 10 MG: ORAL | 90 days supply | Qty: 90 | Fill #0 | Status: AC

## 2021-02-23 ENCOUNTER — Ambulatory Visit: Payer: No Typology Code available for payment source | Admitting: Obstetrics and Gynecology

## 2021-03-04 ENCOUNTER — Other Ambulatory Visit (HOSPITAL_COMMUNITY): Payer: Self-pay

## 2021-03-04 MED FILL — Hydrochlorothiazide Tab 25 MG: ORAL | 90 days supply | Qty: 90 | Fill #0 | Status: AC

## 2021-03-19 MED FILL — Levothyroxine Sodium Tab 75 MCG: ORAL | 90 days supply | Qty: 90 | Fill #0 | Status: AC

## 2021-03-21 ENCOUNTER — Other Ambulatory Visit (HOSPITAL_COMMUNITY): Payer: Self-pay

## 2021-04-12 ENCOUNTER — Other Ambulatory Visit (HOSPITAL_COMMUNITY): Payer: Self-pay

## 2021-04-12 MED ORDER — HYDROCHLOROTHIAZIDE 25 MG PO TABS
25.0000 mg | ORAL_TABLET | Freq: Every day | ORAL | 1 refills | Status: DC
  Filled 2021-04-12 – 2021-06-08 (×3): qty 90, 90d supply, fill #0
  Filled 2021-09-10: qty 90, 90d supply, fill #1

## 2021-04-12 MED ORDER — ENALAPRIL MALEATE 10 MG PO TABS
10.0000 mg | ORAL_TABLET | Freq: Every day | ORAL | 1 refills | Status: DC
  Filled 2021-04-12 – 2021-04-27 (×2): qty 90, 90d supply, fill #0
  Filled 2021-07-24: qty 90, 90d supply, fill #1

## 2021-04-12 MED ORDER — LEVOTHYROXINE SODIUM 75 MCG PO TABS
75.0000 ug | ORAL_TABLET | Freq: Every morning | ORAL | 1 refills | Status: DC
  Filled 2021-04-12 – 2021-09-22 (×2): qty 90, 90d supply, fill #0
  Filled 2021-12-25: qty 90, 90d supply, fill #1

## 2021-04-25 ENCOUNTER — Other Ambulatory Visit (HOSPITAL_COMMUNITY): Payer: Self-pay

## 2021-04-28 ENCOUNTER — Other Ambulatory Visit (HOSPITAL_COMMUNITY): Payer: Self-pay

## 2021-05-04 NOTE — Progress Notes (Signed)
60 y.o. G68P0003 Married Caucasian female here for annual exam.    Prolapse has gotten worse per patient.  Feeling a bulge from the vagina.  No difficulty with BMs.  No fecal incontinence.  (Does have some more loose stools depending on her food choice following cholecystectomy.) Some urgency to void and may leak with this.  No leak with cough or sneeze. (Did not have this in past.) Voiding well.  Would consider surgery at this point.   Did urodynamics about 2 years ago.   PCP: Lujean Amel, MD    Patient's last menstrual period was 11/13/2017.           Sexually active: Yes.    The current method of family planning is vasectomy/Hyst/BSO.    Exercising: Yes.     Walks daily at work Smoker:  no  Health Maintenance: Pap: 11-26-17 Neg:Neg HR HPV, 2017 normal per patient History of abnormal Pap:  no MMG:  06-15-20 3D/Neg/BiRads1 Colonoscopy:  2014 normal per patient, polyps removed;next 10 years BMD:  n/a  Result  n/a TDaP:  05-16-18 Gardasil:   no [id number] blood in past Hep C: 01-05-20 Neg Screening Labs:  PCP.   reports that she has never smoked. She has never used smokeless tobacco. She reports current alcohol use. She reports that she does not use drugs.  Past Medical History:  Diagnosis Date   10-10-19    Allergy    seasonal   Arthritis    HANDS   Complication of anesthesia    Family history of cancer    Hypertension    Ovarian cyst    Sounds like this is a misinterpretation and she is referring to her fibroid   PONV (postoperative nausea and vomiting)    Thyroid disease    hypothyroidism    Past Surgical History:  Procedure Laterality Date   CHOLECYSTECTOMY N/A 04/19/2020   Procedure: LAPAROSCOPIC CHOLECYSTECTOMY;  Surgeon: Kinsinger, Arta Bruce, MD;  Location: [facility];  Service: General;  Laterality: N/A;   PILONIDAL CYST EXCISION  1987   ROBOTIC ASSISTED TOTAL HYSTERECTOMY WITH BILATERAL SALPINGO OOPHERECTOMY Bilateral 01/31/2018   Procedure: XI ROBOTIC  ASSISTED TOTAL HYSTERECTOMY (FOR UTERUS GREATER THAN  250 GRAMS)WITH BILATERAL SALPINGO OOPHORECTOMY;  Surgeon: Everitt Amber, MD;  Location: [facility];  Service: Gynecology;  Laterality: Bilateral;    Current Outpatient Medications  Medication Sig Dispense Refill   acetaminophen (TYLENOL) 325 MG tablet Take 2 tablets (650 mg total) by mouth every 6 (six) hours as needed for moderate pain or headache.     enalapril (VASOTEC) 10 MG tablet Take 1 tablet (10 mg total) by mouth daily. 90 tablet 1   fexofenadine (ALLEGRA) 180 MG tablet Take 180 mg by mouth as needed for allergies or rhinitis.     hydrochlorothiazide (HYDRODIURIL) 25 MG tablet Take 1 tablet (25 mg total) by mouth daily. 90 tablet 1   levothyroxine (SYNTHROID) 75 MCG tablet TAKE 1 TABLET (75 MCG TOTAL) BY MOUTH DAILY. 90 tablet 3   levothyroxine (SYNTHROID) 75 MCG tablet Take 1 tablet (75 mcg total) by mouth in the morning on an empty stomach once daily 90 tablet 1   Multiple Vitamin (MULTIVITAMIN WITH MINERALS) TABS tablet Take 1 tablet by mouth daily.     nystatin (MYCOSTATIN/NYSTOP) powder Apply 1 application topically 3 (three) times daily. 60 g 1   No current facility-administered medications for this visit.    Family History  Problem Relation Age of Onset   Arthritis Mother    Cancer Mother  glioblastoma   Varicose Veins Mother    Arthritis Father    Diabetes Father    Hearing loss Father    Heart disease Father    Hypertension Father    Vision loss Father    Cancer Maternal Grandmother        throat   Varicose Veins Maternal Grandmother    Stroke Maternal Grandfather    Stroke Paternal Grandfather    Thyroid disease Sister        thyroidectomy   Cancer Maternal Aunt        breast   Kidney disease Paternal Grandmother    Breast cancer Cousin        mat cousin, daughter of aunt with breast cancer, dx in her 16s   Breast cancer Cousin        mat first cousin, daughter of aunt without cancer, dx in her 11s    Colon cancer Cousin 3       mat cousin; son of mat uncle    Review of Systems  Genitourinary:        Vaginal prolapse  All other systems reviewed and are negative.  Exam:   BP 118/80   Pulse 70   Ht '5\' 2"'$  (1.575 m)   Wt 218 lb (98.9 kg)   LMP 11/13/2017   SpO2 98%   BMI 39.87 kg/m     General appearance: alert, cooperative and appears stated age Head: normocephalic, without obvious abnormality, atraumatic Neck: no adenopathy, supple, symmetrical, trachea midline and thyroid normal to inspection and palpation Lungs: clear to auscultation bilaterally Breasts: normal appearance, no masses or tenderness, No nipple retraction or dimpling, No nipple discharge or bleeding, No axillary adenopathy Heart: regular rate and rhythm Abdomen: soft, non-tender; no masses, no organomegaly Extremities: extremities normal, atraumatic, no cyanosis or edema Skin: skin color, texture, turgor normal. No rashes or lesions Lymph nodes: cervical, supraclavicular, and axillary nodes normal. Neurologic: grossly normal  Pelvic: External genitalia:  no lesions              No abnormal inguinal nodes palpated.              Urethra:  normal appearing urethra with no masses, tenderness or lesions              Bartholins and Skenes: normal                 Vagina: normal appearing vagina with normal color and discharge, no lesions              Cervix: absent              Pap taken: no Bimanual Exam:  Uterus:  absent              Adnexa: no mass, fullness, tenderness              Rectal exam: yes.  Confirms.              Anus:  normal sphincter tone, no lesions  Chaperone was present for exam:  Estill Bamberg, CMA  Assessment:   Well woman visit with gynecologic exam. Status post robotic TLH/BSO with Dr. Denman George.  Fibroids. Vaginal vault eversion.  Hypothyroidism.   Plan: Mammogram screening discussed. Self breast awareness reviewed. Pap and HR HPV as above. Guidelines for Calcium, Vitamin D, regular  exercise program including cardiovascular and weight bearing exercise. We discussed vaginal vault prolapse and pessary versus surgical correction with robotic sacrocolpopexy.  She would like referral  for sacrocolpopexy.  Follow up annually and prn.   After visit summary provided.   An additional 20 minutes spent discussing pelvic organ prolapse and potential care.

## 2021-05-12 ENCOUNTER — Ambulatory Visit (INDEPENDENT_AMBULATORY_CARE_PROVIDER_SITE_OTHER): Payer: No Typology Code available for payment source | Admitting: Obstetrics and Gynecology

## 2021-05-12 ENCOUNTER — Encounter: Payer: Self-pay | Admitting: Obstetrics and Gynecology

## 2021-05-12 ENCOUNTER — Other Ambulatory Visit: Payer: Self-pay

## 2021-05-12 VITALS — BP 118/80 | HR 70 | Ht 62.0 in | Wt 218.0 lb

## 2021-05-12 DIAGNOSIS — N819 Female genital prolapse, unspecified: Secondary | ICD-10-CM | POA: Diagnosis not present

## 2021-05-12 DIAGNOSIS — Z01419 Encounter for gynecological examination (general) (routine) without abnormal findings: Secondary | ICD-10-CM | POA: Diagnosis not present

## 2021-05-12 NOTE — Patient Instructions (Signed)

## 2021-05-13 ENCOUNTER — Other Ambulatory Visit: Payer: Self-pay | Admitting: Obstetrics and Gynecology

## 2021-05-13 DIAGNOSIS — Z1231 Encounter for screening mammogram for malignant neoplasm of breast: Secondary | ICD-10-CM

## 2021-06-07 ENCOUNTER — Ambulatory Visit: Payer: No Typology Code available for payment source | Attending: Internal Medicine

## 2021-06-07 ENCOUNTER — Other Ambulatory Visit: Payer: Self-pay

## 2021-06-07 ENCOUNTER — Other Ambulatory Visit (HOSPITAL_COMMUNITY): Payer: Self-pay

## 2021-06-07 ENCOUNTER — Other Ambulatory Visit (HOSPITAL_BASED_OUTPATIENT_CLINIC_OR_DEPARTMENT_OTHER): Payer: Self-pay

## 2021-06-07 DIAGNOSIS — Z23 Encounter for immunization: Secondary | ICD-10-CM

## 2021-06-07 MED ORDER — PFIZER-BIONT COVID-19 VAC-TRIS 30 MCG/0.3ML IM SUSP
INTRAMUSCULAR | 0 refills | Status: DC
  Filled 2021-06-07: qty 0.3, 1d supply, fill #0

## 2021-06-07 NOTE — Progress Notes (Signed)
   Covid-19 Vaccination Clinic  Name:  Kimberly Wall    MRN: [id number] DOB: Aug 18, 1961  06/07/2021  [name] was observed post Covid-19 immunization for 15 minutes without incident. She was provided with Vaccine Information Sheet and instruction to access the V-Safe system.   [name] was instructed to call 911 with any severe reactions post vaccine: Difficulty breathing  Swelling of face and throat  A fast heartbeat  A bad rash all over body  Dizziness and weakness   Immunizations Administered     Name Date Dose VIS Date Route   PFIZER Comrnaty(Gray TOP) Covid-19 Vaccine 06/07/2021  9:52 AM 0.3 mL 09/16/2020 Intramuscular   Manufacturer: [employer]   Lot: [id number]   NDC: [phone]

## 2021-06-08 ENCOUNTER — Other Ambulatory Visit (HOSPITAL_COMMUNITY): Payer: Self-pay

## 2021-06-18 MED FILL — Levothyroxine Sodium Tab 75 MCG: ORAL | 90 days supply | Qty: 90 | Fill #1 | Status: AC

## 2021-06-20 ENCOUNTER — Other Ambulatory Visit (HOSPITAL_COMMUNITY): Payer: Self-pay

## 2021-06-20 ENCOUNTER — Other Ambulatory Visit: Payer: Self-pay

## 2021-07-04 ENCOUNTER — Ambulatory Visit: Payer: No Typology Code available for payment source
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
## 2021-07-18 ENCOUNTER — Encounter: Payer: Self-pay | Admitting: Obstetrics and Gynecology

## 2021-07-18 ENCOUNTER — Ambulatory Visit
Admission: RE | Admit: 2021-07-18 | Discharge: 2021-07-18 | Disposition: A | Discharge status: Home / Self Care | Payer: No Typology Code available for payment source | Source: Ambulatory Visit | Attending: Obstetrics and Gynecology | Admitting: Obstetrics and Gynecology

## 2021-07-18 ENCOUNTER — Other Ambulatory Visit: Payer: Self-pay

## 2021-07-18 ENCOUNTER — Ambulatory Visit (INDEPENDENT_AMBULATORY_CARE_PROVIDER_SITE_OTHER): Payer: BC Managed Care – PPO | Admitting: Obstetrics and Gynecology

## 2021-07-18 VITALS — BP 147/86 | HR 71 | Ht 62.0 in | Wt 218.0 lb

## 2021-07-18 DIAGNOSIS — N993 Prolapse of vaginal vault after hysterectomy: Secondary | ICD-10-CM

## 2021-07-18 DIAGNOSIS — N3281 Overactive bladder: Secondary | ICD-10-CM

## 2021-07-18 DIAGNOSIS — R350 Frequency of micturition: Secondary | ICD-10-CM | POA: Diagnosis not present

## 2021-07-18 DIAGNOSIS — Z1231 Encounter for screening mammogram for malignant neoplasm of breast: Secondary | ICD-10-CM | POA: Diagnosis not present

## 2021-07-18 DIAGNOSIS — N816 Rectocele: Secondary | ICD-10-CM

## 2021-07-18 DIAGNOSIS — N8110 Cystocele, unspecified: Secondary | ICD-10-CM

## 2021-07-18 LAB — POCT URINALYSIS DIPSTICK
Appearance: NORMAL
Bilirubin, UA: NEGATIVE
Blood, UA: NEGATIVE
Glucose, UA: NEGATIVE
Ketones, UA: NEGATIVE
Leukocytes, UA: NEGATIVE
Nitrite, UA: NEGATIVE
Protein, UA: NEGATIVE
Spec Grav, UA: 1.015 (ref 1.010–1.025)
Urobilinogen, UA: 0.2 E.U./dL
pH, UA: 7 (ref 5.0–8.0)

## 2021-07-20 ENCOUNTER — Other Ambulatory Visit: Payer: Self-pay

## 2021-07-20 ENCOUNTER — Ambulatory Visit: Payer: No Typology Code available for payment source

## 2021-07-20 DIAGNOSIS — Z23 Encounter for immunization: Secondary | ICD-10-CM

## 2021-07-25 ENCOUNTER — Other Ambulatory Visit (HOSPITAL_COMMUNITY): Payer: Self-pay

## 2021-08-26 ENCOUNTER — Other Ambulatory Visit (HOSPITAL_COMMUNITY): Payer: Self-pay

## 2021-08-26 ENCOUNTER — Encounter: Payer: Self-pay | Admitting: Nurse Practitioner

## 2021-08-26 ENCOUNTER — Ambulatory Visit: Payer: No Typology Code available for payment source | Admitting: Nurse Practitioner

## 2021-08-26 ENCOUNTER — Other Ambulatory Visit: Payer: Self-pay

## 2021-08-26 VITALS — BP 130/88 | HR 91 | Temp 98.7°F | Resp 16

## 2021-08-26 DIAGNOSIS — J0110 Acute frontal sinusitis, unspecified: Secondary | ICD-10-CM

## 2021-08-26 DIAGNOSIS — R051 Acute cough: Secondary | ICD-10-CM

## 2021-08-26 MED ORDER — AMOXICILLIN-POT CLAVULANATE 875-125 MG PO TABS
1.0000 | ORAL_TABLET | Freq: Two times a day (BID) | ORAL | 0 refills | Status: AC
  Filled 2021-08-26: qty 14, 7d supply, fill #0

## 2021-08-26 MED ORDER — BENZONATATE 100 MG PO CAPS
100.0000 mg | ORAL_CAPSULE | Freq: Three times a day (TID) | ORAL | 0 refills | Status: AC | PRN
  Filled 2021-08-26: qty 30, 10d supply, fill #0

## 2021-08-26 NOTE — Progress Notes (Signed)
Subjective:    Patient ID: Kimberly Wall, female    DOB: [birth year], 60 y.o.   MRN: [id number]  HPI  60 year old female presenting to [facility] with complaints of sinus pressure and ear pressure for the past 48 hours she suffers from seasonal allergies and her congestion typically worsens 1-2 times a year into a sinus infection   She has had a flu vaccine and Covid vaccine with booster.    Denies fever.   She has been using Afrin, Dayquil/Nyquil for relief   Today's Vitals   08/26/21 1011  BP: 130/88  Pulse: 91  Resp: 16  Temp: 98.7 F (37.1 C)  TempSrc: Tympanic  SpO2: 97%   There is no height or weight on file to calculate BMI.   Past Medical History:  Diagnosis Date   10-10-19    Allergy    seasonal   Arthritis    HANDS   Complication of anesthesia    Family history of cancer    Hypertension    Ovarian cyst    Sounds like this is a misinterpretation and she is referring to her fibroid   PONV (postoperative nausea and vomiting)    Thyroid disease    hypothyroidism     Current Outpatient Medications  Medication Instructions   acetaminophen (TYLENOL) 650 mg, Oral, Every 6 hours PRN   COVID-19 mRNA Vac-TriS, Pfizer, (PFIZER-BIONT COVID-19 VAC-TRIS) SUSP injection Intramuscular   enalapril (VASOTEC) 10 mg, Oral, Daily   fexofenadine (ALLEGRA) 180 mg, Oral, As needed   hydrochlorothiazide (HYDRODIURIL) 25 mg, Oral, Daily   levothyroxine (SYNTHROID) 75 MCG tablet TAKE 1 TABLET (75 MCG TOTAL) BY MOUTH DAILY.   levothyroxine (SYNTHROID) 75 MCG tablet Take 1 tablet (75 mcg total) by mouth in the morning on an empty stomach once daily   Multiple Vitamin (MULTIVITAMIN WITH MINERALS) TABS tablet 1 tablet, Oral, Daily   nystatin (MYCOSTATIN/NYSTOP) powder 1 application, Topical, 3 times daily     No Known Allergies   Review of Systems  Constitutional: Negative.   HENT:  Positive for congestion, sinus pressure and sinus pain.   Respiratory:  Positive  for cough.   Cardiovascular: Negative.   Gastrointestinal: Negative.   Genitourinary: Negative.   Musculoskeletal: Negative.   Neurological: Negative.   Hematological: Negative.       Objective:   Physical Exam Constitutional:      Appearance: Normal appearance.  HENT:     Head: Normocephalic.     Right Ear: Tympanic membrane and external ear normal.     Left Ear: Tympanic membrane and external ear normal.     Ears:     Comments: Dry skin to bilateral canals without erythema     Nose: Congestion present.     Comments: Sinus tenderness to bilateral maxillary and frontal sinus regions     Mouth/Throat:     Mouth: Mucous membranes are moist.  Eyes:     Pupils: Pupils are equal, round, and reactive to light.  Cardiovascular:     Rate and Rhythm: Normal rate and regular rhythm.     Heart sounds: Normal heart sounds.  Pulmonary:     Effort: Pulmonary effort is normal.     Breath sounds: Normal breath sounds.  Musculoskeletal:     Cervical back: Normal range of motion.  Skin:    General: Skin is warm.  Neurological:     General: No focal deficit present.     Mental Status: She is alert.  Psychiatric:        Mood and Affect: Mood normal.          Assessment & Plan:  1. Acute non-recurrent frontal sinusitis  - amoxicillin-clavulanate (AUGMENTIN) 875-125 MG tablet; Take 1 tablet by mouth 2 (two) times daily for 7 days. Take with food  Dispense: 14 tablet; Refill: 0  Continue OTC support with Afrin for up to 72 hours, Flonase and Coricidin for congestions support  2. Acute cough  - benzonatate (TESSALON) 100 MG capsule; Take 1 capsule (100 mg total) by mouth 3 (three) times daily as needed for up to 10 days for cough.  Dispense: 30 capsule; Refill: 0     RTC if symptoms persist or with new concerns as discussed

## 2021-09-12 ENCOUNTER — Other Ambulatory Visit (HOSPITAL_COMMUNITY): Payer: Self-pay

## 2021-09-21 ENCOUNTER — Other Ambulatory Visit: Payer: Self-pay

## 2021-09-22 ENCOUNTER — Other Ambulatory Visit (HOSPITAL_COMMUNITY): Payer: Self-pay

## 2021-11-09 ENCOUNTER — Other Ambulatory Visit (HOSPITAL_COMMUNITY): Payer: Self-pay

## 2021-11-10 ENCOUNTER — Other Ambulatory Visit (HOSPITAL_COMMUNITY): Payer: Self-pay

## 2021-11-10 MED ORDER — ENALAPRIL MALEATE 10 MG PO TABS
10.0000 mg | ORAL_TABLET | Freq: Every day | ORAL | 0 refills | Status: DC
  Filled 2021-11-10: qty 90, 90d supply, fill #0

## 2021-12-01 DIAGNOSIS — E039 Hypothyroidism, unspecified: Secondary | ICD-10-CM | POA: Diagnosis not present

## 2021-12-01 DIAGNOSIS — Z131 Encounter for screening for diabetes mellitus: Secondary | ICD-10-CM | POA: Diagnosis not present

## 2021-12-01 DIAGNOSIS — I10 Essential (primary) hypertension: Secondary | ICD-10-CM | POA: Diagnosis not present

## 2021-12-01 DIAGNOSIS — Z0000 Encounter for general adult medical examination without abnormal findings: Secondary | ICD-10-CM | POA: Diagnosis not present

## 2021-12-10 ENCOUNTER — Other Ambulatory Visit (HOSPITAL_COMMUNITY): Payer: Self-pay

## 2021-12-12 ENCOUNTER — Other Ambulatory Visit (HOSPITAL_COMMUNITY): Payer: Self-pay

## 2021-12-13 ENCOUNTER — Other Ambulatory Visit (HOSPITAL_COMMUNITY): Payer: Self-pay

## 2021-12-14 ENCOUNTER — Other Ambulatory Visit (HOSPITAL_COMMUNITY): Payer: Self-pay

## 2021-12-14 MED ORDER — HYDROCHLOROTHIAZIDE 25 MG PO TABS
25.0000 mg | ORAL_TABLET | Freq: Every day | ORAL | 3 refills | Status: DC
  Filled 2021-12-14: qty 90, 90d supply, fill #0
  Filled 2022-03-18: qty 90, 90d supply, fill #1
  Filled 2022-06-26: qty 90, 90d supply, fill #2
  Filled 2022-10-01: qty 90, 90d supply, fill #3

## 2021-12-26 ENCOUNTER — Other Ambulatory Visit (HOSPITAL_COMMUNITY): Payer: Self-pay

## 2022-01-13 ENCOUNTER — Other Ambulatory Visit (HOSPITAL_COMMUNITY): Payer: Self-pay

## 2022-01-31 DIAGNOSIS — L821 Other seborrheic keratosis: Secondary | ICD-10-CM | POA: Diagnosis not present

## 2022-01-31 DIAGNOSIS — L565 Disseminated superficial actinic porokeratosis (DSAP): Secondary | ICD-10-CM | POA: Diagnosis not present

## 2022-01-31 DIAGNOSIS — D225 Melanocytic nevi of trunk: Secondary | ICD-10-CM | POA: Diagnosis not present

## 2022-01-31 DIAGNOSIS — L218 Other seborrheic dermatitis: Secondary | ICD-10-CM | POA: Diagnosis not present

## 2022-02-08 ENCOUNTER — Other Ambulatory Visit (HOSPITAL_COMMUNITY): Payer: Self-pay

## 2022-02-09 ENCOUNTER — Other Ambulatory Visit (HOSPITAL_COMMUNITY): Payer: Self-pay

## 2022-02-09 MED ORDER — ENALAPRIL MALEATE 10 MG PO TABS
10.0000 mg | ORAL_TABLET | Freq: Every day | ORAL | 3 refills | Status: DC
  Filled 2022-02-09: qty 90, 90d supply, fill #0
  Filled 2022-05-15: qty 50, 50d supply, fill #1
  Filled 2022-05-16: qty 40, 40d supply, fill #1
  Filled 2022-08-15: qty 90, 90d supply, fill #2
  Filled 2022-11-20: qty 90, 90d supply, fill #3

## 2022-03-20 ENCOUNTER — Other Ambulatory Visit (HOSPITAL_COMMUNITY): Payer: Self-pay

## 2022-04-01 ENCOUNTER — Other Ambulatory Visit (HOSPITAL_COMMUNITY): Payer: Self-pay

## 2022-04-03 ENCOUNTER — Other Ambulatory Visit (HOSPITAL_COMMUNITY): Payer: Self-pay

## 2022-04-03 MED ORDER — LEVOTHYROXINE SODIUM 75 MCG PO TABS
75.0000 ug | ORAL_TABLET | Freq: Every morning | ORAL | 3 refills | Status: DC
  Filled 2022-04-03: qty 90, 90d supply, fill #0
  Filled 2022-07-08: qty 90, 90d supply, fill #1
  Filled 2022-10-01: qty 90, 90d supply, fill #2
  Filled 2023-01-11: qty 90, 90d supply, fill #3

## 2022-05-09 ENCOUNTER — Encounter: Payer: Self-pay | Admitting: Obstetrics and Gynecology

## 2022-05-09 ENCOUNTER — Ambulatory Visit (INDEPENDENT_AMBULATORY_CARE_PROVIDER_SITE_OTHER): Payer: BC Managed Care – PPO | Admitting: Obstetrics and Gynecology

## 2022-05-09 VITALS — BP 153/87 | HR 84

## 2022-05-09 DIAGNOSIS — N8110 Cystocele, unspecified: Secondary | ICD-10-CM | POA: Diagnosis not present

## 2022-05-09 DIAGNOSIS — N816 Rectocele: Secondary | ICD-10-CM

## 2022-05-09 DIAGNOSIS — N3941 Urge incontinence: Secondary | ICD-10-CM | POA: Diagnosis not present

## 2022-05-09 DIAGNOSIS — N993 Prolapse of vaginal vault after hysterectomy: Secondary | ICD-10-CM | POA: Diagnosis not present

## 2022-05-09 NOTE — Progress Notes (Signed)
[facility] Return Visit  SUBJECTIVE  History of Present Illness: Kimberly Wall is a 61 y.o. female seen in follow-up for prolapse.   Prolapse is becoming more noticable for her. Children have officially moved out of her house.   She feels that she has less leakage overall- tries not to wait too long. Going to urinate 5-6 times per day. Has been trying to keep under 2 cups coffee per day and increase water intake.    Past Medical History: Patient  has a past medical history of 10-10-19, Allergy, Arthritis, Complication of anesthesia, Family history of cancer, Hypertension, Ovarian cyst, PONV (postoperative nausea and vomiting), and Thyroid disease.   Past Surgical History: She  has a past surgical history that includes Pilonidal cyst excision (1987); Robotic assisted total hysterectomy with bilateral salpingo oophorectomy (Bilateral, 01/31/2018); and Cholecystectomy (N/A, 04/19/2020).   Medications: She has a current medication list which includes the following prescription(s): acetaminophen, pfizer-biont covid-19 vac-tris, enalapril, fexofenadine, hydrochlorothiazide, hydrochlorothiazide, levothyroxine, multivitamin with minerals, nystatin, and levothyroxine.   Allergies: Patient has No Known Allergies.   Social History: Patient  reports that she has never smoked. She has never used smokeless tobacco. She reports current alcohol use. She reports that she does not use drugs.      OBJECTIVE     Physical Exam: Vitals:   05/09/22 0829  BP: (!) 153/87  Pulse: 84   Gen: No apparent distress, A&O x 3.  Detailed Urogynecologic Evaluation:  Normal external genitalia. Speculum reveals normal mucosa with atrophy. No masses on bimanual exam.   POPQ:  POP-Q  3                                            Aa   6                                           Ba  6                                              C   6.5                                            Gh  3                                             Pb  6.5                                            tvl   3                                            Ap  6                                            Bp                                                 D      ASSESSMENT AND PLAN    Ms. Bond is a 61 y.o. with:  1. Prolapse of anterior vaginal wall   2. Vaginal vault prolapse after hysterectomy   3. Prolapse of posterior vaginal wall   4. Urge incontinence    - She is interested in surgery. Discussed options of vaginal native tissue vs robotic mesh prolapse repair. She would like to proceed with mesh sacrocolpopexy. Will also need perineorrhaphy.  - Will have her proceed with urodynamic testing to evaluate incontinence further. Discussed need for potential procedure for SUI.   Return for urodynamics. Wants to schedule surgery for November.   Jaquita Folds, MD  Time spent: I spent 25 minutes dedicated to the care of this patient on the date of this encounter to include pre-visit review of records, face-to-face time with the patient and post visit documentation.

## 2022-05-09 NOTE — Patient Instructions (Addendum)

## 2022-05-16 ENCOUNTER — Other Ambulatory Visit (HOSPITAL_COMMUNITY): Payer: Self-pay

## 2022-06-09 DIAGNOSIS — E039 Hypothyroidism, unspecified: Secondary | ICD-10-CM | POA: Diagnosis not present

## 2022-06-26 ENCOUNTER — Other Ambulatory Visit (HOSPITAL_COMMUNITY): Payer: Self-pay

## 2022-07-04 ENCOUNTER — Other Ambulatory Visit (HOSPITAL_COMMUNITY): Payer: Self-pay

## 2022-07-06 ENCOUNTER — Encounter: Payer: Self-pay | Admitting: Obstetrics and Gynecology

## 2022-07-06 ENCOUNTER — Ambulatory Visit (INDEPENDENT_AMBULATORY_CARE_PROVIDER_SITE_OTHER): Payer: BC Managed Care – PPO | Admitting: Obstetrics and Gynecology

## 2022-07-06 VITALS — BP 151/93 | HR 58

## 2022-07-06 DIAGNOSIS — R350 Frequency of micturition: Secondary | ICD-10-CM | POA: Diagnosis not present

## 2022-07-06 DIAGNOSIS — R3915 Urgency of urination: Secondary | ICD-10-CM

## 2022-07-06 LAB — POCT URINALYSIS DIPSTICK
Bilirubin, UA: NEGATIVE
Blood, UA: NEGATIVE
Glucose, UA: NEGATIVE
Ketones, UA: NEGATIVE
Leukocytes, UA: NEGATIVE
Nitrite, UA: NEGATIVE
Protein, UA: NEGATIVE
Spec Grav, UA: 1.02 (ref 1.010–1.025)
Urobilinogen, UA: 0.2 E.U./dL
pH, UA: 6 (ref 5.0–8.0)

## 2022-07-06 NOTE — Progress Notes (Signed)
[facility] Urodynamics Procedure  Referring Physician: Lujean Amel, MD Date of Procedure: 07/06/2022  Kimberly Wall is a 61 y.o. female who presents for urodynamic evaluation. Indication(s) for study: occult SUI and OAB  Vital Signs: LMP 11/13/2017   Laboratory Results: A catheterized urine specimen revealed:  POC urine: negative  Voiding Diary: Not performed.   Procedure Timeout:  The correct patient was verified and the correct procedure was verified. The patient was in the correct position and safety precautions were reviewed based on at the patient's history.  Urodynamic Procedure A 32F dual lumen urodynamics catheter was placed under sterile conditions into the patient's bladder. A 32F catheter was placed into the rectum in order to measure abdominal pressure. EMG patches were placed in the appropriate position.  All connections were confirmed and calibrations/adjusted made. Saline was instilled into the bladder through the dual lumen catheters.  Cough/valsalva pressures were measured periodically during filling.  Patient was allowed to void.  The bladder was then emptied of its residual.  UROFLOW: Revealed a Qmax of 21.8 mL/sec.  She voided 172.2 mL and had a residual of 45 mL.  It was a normal pattern and represented normal habits.  CMG: This was performed with sterile water in the sitting position at a fill rate of 30 mL/min.    First sensation of fullness was 61 mLs,  First urge was 202 mLs,  Strong urge was 260 mLs and  Capacity was 417 mLs  Stress incontinence was not demonstrated Highest negative Barrier CLPP was 152 cmH20 at 337 ml. Highest negative Barrier VLPP was 98 cmH20 at 337 ml in the standing position.  Detrusor function was normal, with no phasic contractions seen.   Compliance:  normal. End fill detrusor pressure was 3.6cmH20.  Calculated compliance was 143m/cmH20  UPP: Not performed   MICTURITION STUDY: Voiding was performed with  reduction using scopettes in the sitting position.  Pdet at Qmax was 24.3 cm of water.  Qmax was 22 mL/sec.  It was a normal pattern.  She voided 407 mL and had a residual of 10 mL.  It was a volitional void, sustained detrusor contraction was present and abdominal straining was not present  EMG: This was performed with patches.  She had voluntary contractions, recruitment with fill was present and urethral sphincter was relaxed with void.  The details of the procedure with the study tracings have been scanned into EPIC.   Urodynamic Impression:  1. Sensation was normal; capacity was normal 2. Stress Incontinence was not demonstrated. 3. Detrusor Overactivity was not demonstrated. 4. Emptying was normal with a normal PVR, a sustained detrusor contraction present,  abdominal straining not present, normal urethral sphincter activity on EMG.  Plan: - The patient will follow up  to discuss the findings and treatment options.

## 2022-07-06 NOTE — Addendum Note (Signed)
Addended by: Elita Quick on: 07/06/2022 09:57 AM   Modules accepted: Orders

## 2022-07-06 NOTE — Patient Instructions (Signed)
Taking Care of Yourself after Urodynamics, Cystoscopy, Coaptite Injection, or Botox Injection   Drink plenty of water for a day or two following your procedure. Try to have about 8 ounces (one cup) at a time, and do this 6 times or more per day unless you have fluid restrictitons AVOID irritative beverages such as coffee, tea, soda, alcoholic or citrus drinks for a day or two, as this may cause burning with urination.  For the first 1-2 days after the procedure, your urine may be pink or red in color. You may have some blood in your urine as a normal side effect of the procedure. Large amounts of bleeding or difficulty urinating are NOT normal. Call the nurse line if this happens or go to the nearest Emergency Room if the bleeding is heavy or you cannot urinate at all and it is after hours. If you had a Coaptite injection in the urethra and need to be catheterized afterward, ask for a pediatric catheter to be used (size 10 or 12-French) so the Coaptite material is not pushed out of place.   You may experience some discomfort or a burning sensation with urination after having this procedure. You can use over the counter Azo or pyridium to help with burning and follow the instructions on the packaging. If it does not improve within 1-2 days, or other symptoms appear (fever, chills, or difficulty urinating) call the office to speak to a nurse.  You may return to normal daily activities such as work, school, driving, exercising and housework on the day of the procedure.

## 2022-07-10 ENCOUNTER — Other Ambulatory Visit (HOSPITAL_COMMUNITY): Payer: Self-pay

## 2022-07-10 NOTE — Progress Notes (Signed)
Kimberly Wall is a 61 y.o. female dropped off FMLA paper [place] from [facility]. Pt notified there will be a 10-14 day turn around for forms to be ready.  Pt notified  she will be contacted as soon as the from are ready and have been faxed.  Pre-op date: 08/09/2022 Surgery date: 08/23/2022 Post op date:10/06/2022

## 2022-07-19 ENCOUNTER — Other Ambulatory Visit: Payer: Self-pay | Admitting: Family Medicine

## 2022-07-19 DIAGNOSIS — Z1231 Encounter for screening mammogram for malignant neoplasm of breast: Secondary | ICD-10-CM

## 2022-07-25 ENCOUNTER — Ambulatory Visit
Admission: RE | Admit: 2022-07-25 | Discharge: 2022-07-25 | Disposition: A | Discharge status: Home / Self Care | Payer: BC Managed Care – PPO | Source: Ambulatory Visit | Attending: Family Medicine | Admitting: Family Medicine

## 2022-07-25 DIAGNOSIS — Z1231 Encounter for screening mammogram for malignant neoplasm of breast: Secondary | ICD-10-CM

## 2022-08-01 DIAGNOSIS — Z23 Encounter for immunization: Secondary | ICD-10-CM | POA: Diagnosis not present

## 2022-08-08 NOTE — Progress Notes (Unsigned)
[facility] Pre-Operative visit  Subjective Chief Complaint: Kimberly Wall presents for a preoperative encounter.   History of Present Illness: Kimberly Wall is a 61 y.o. female who presents for preoperative visit.  She is scheduled to undergo Exam under anesthesia, Robotic assisted sacrocolpopexy, perineorrhaphy, cystoscopy on 08/23/22.  Her symptoms include vaginal bulge, and she was was found to have Stage IV anterior, Stage IV posterior, Stage IV apical prolapse.   Urodynamic Impression:  1. Sensation was normal; capacity was normal 2. Stress Incontinence was not demonstrated. 3. Detrusor Overactivity was not demonstrated. 4. Emptying was normal with a normal PVR, a sustained detrusor contraction present,  abdominal straining not present, normal urethral sphincter activity on EMG.  Past Medical History:  Diagnosis Date   10-10-19    Allergy    seasonal   Arthritis    HANDS   Complication of anesthesia    Family history of cancer    Hypertension    Ovarian cyst    Sounds like this is a misinterpretation and she is referring to her fibroid   PONV (postoperative nausea and vomiting)    Thyroid disease    hypothyroidism     Past Surgical History:  Procedure Laterality Date   CHOLECYSTECTOMY N/A 04/19/2020   Procedure: LAPAROSCOPIC CHOLECYSTECTOMY;  Surgeon: Kinsinger, Arta Bruce, MD;  Location: [facility];  Service: General;  Laterality: N/A;   PILONIDAL CYST EXCISION  1987   ROBOTIC ASSISTED TOTAL HYSTERECTOMY WITH BILATERAL SALPINGO OOPHERECTOMY Bilateral 01/31/2018   Procedure: XI ROBOTIC ASSISTED TOTAL HYSTERECTOMY (FOR UTERUS GREATER THAN  250 GRAMS)WITH BILATERAL SALPINGO OOPHORECTOMY;  Surgeon: Everitt Amber, MD;  Location: [facility];  Service: Gynecology;  Laterality: Bilateral;    has No Known Allergies.   Family History  Problem Relation Age of Onset   Arthritis Mother    Cancer Mother        glioblastoma   Varicose Veins Mother    Arthritis Father     Diabetes Father    Hearing loss Father    Heart disease Father    Hypertension Father    Vision loss Father    Thyroid disease Sister        thyroidectomy   Breast cancer Maternal Aunt    Cancer Maternal Aunt        breast   Cancer Maternal Grandmother        throat   Varicose Veins Maternal Grandmother    Stroke Maternal Grandfather    Kidney disease Paternal Grandmother    Stroke Paternal Grandfather    Breast cancer Cousin        mat cousin, daughter of aunt with breast cancer, dx in her 46s   Breast cancer Cousin        mat first cousin, daughter of aunt without cancer, dx in her 68s   Colon cancer Cousin 64       mat cousin; son of mat uncle    Social History   Tobacco Use   Smoking status: Never   Smokeless tobacco: Never  Vaping Use   Vaping Use: Never used  Substance Use Topics   Alcohol use: Yes    Comment: 2/month   Drug use: No     Review of Systems was negative for a full 10 system review except as noted in the History of Present Illness.   Current Outpatient Medications:    acetaminophen (TYLENOL) 500 MG tablet, Take 1 tablet (500 mg total) by mouth every 6 (six) hours as needed (  pain)., Disp: 30 tablet, Rfl: 0   ibuprofen (ADVIL) 600 MG tablet, Take 1 tablet (600 mg total) by mouth every 6 (six) hours as needed., Disp: 30 tablet, Rfl: 0   oxyCODONE (OXY IR/ROXICODONE) 5 MG immediate release tablet, Take 1 tablet (5 mg total) by mouth every 4 (four) hours as needed for severe pain., Disp: 10 tablet, Rfl: 0   polyethylene glycol powder (GLYCOLAX/MIRALAX) 17 GM/SCOOP powder, Take 17 g by mouth daily. Drink 17g (1 scoop) dissolved in water per day., Disp: 255 g, Rfl: 0   acetaminophen (TYLENOL) 325 MG tablet, Take 2 tablets (650 mg total) by mouth every 6 (six) hours as needed for moderate pain or headache., Disp: , Rfl:    COVID-19 mRNA Vac-TriS, Pfizer, (PFIZER-BIONT COVID-19 VAC-TRIS) SUSP injection, Inject into the muscle., Disp: 0.3 mL, Rfl: 0    enalapril (VASOTEC) 10 MG tablet, Take 1 tablet (10 mg total) by mouth daily., Disp: 90 tablet, Rfl: 3   fexofenadine (ALLEGRA) 180 MG tablet, Take 180 mg by mouth as needed for allergies or rhinitis., Disp: , Rfl:    hydrochlorothiazide (HYDRODIURIL) 25 MG tablet, Take 1 tablet (25 mg total) by mouth daily., Disp: 90 tablet, Rfl: 1   hydrochlorothiazide (HYDRODIURIL) 25 MG tablet, Take 1 tablet (25 mg total) by mouth daily., Disp: 90 tablet, Rfl: 3   levothyroxine (SYNTHROID) 75 MCG tablet, Take 1 tablet (75 mcg total) by mouth in the morning on an empty stomach, Disp: 90 tablet, Rfl: 3   Multiple Vitamin (MULTIVITAMIN WITH MINERALS) TABS tablet, Take 1 tablet by mouth daily., Disp: , Rfl:    nystatin (MYCOSTATIN/NYSTOP) powder, Apply 1 application topically 3 (three) times daily., Disp: 60 g, Rfl: 1   Objective Vitals:   08/09/22 0810  BP: (!) 146/95  Pulse: 75    Gen: NAD CV: S1 S2 RRR Lungs: Clear to auscultation bilaterally Abd: soft, nontender   Previous Pelvic Exam showed: POPQ (05/09/22):   POP-Q   3                                            Aa   6                                           Ba   6                                              C    6.5                                            Gh   3                                            Pb   6.5  tvl    3                                            Ap   6                                            Bp                                                  D       Assessment/ Plan  Assessment: The patient is a 61 y.o. year old scheduled to undergo Exam under anesthesia, Robotic assisted sacrocolpopexy, perineorrhaphy, cystoscopy. Verbal consent was obtained for these procedures.  Plan: General Surgical Consent: The patient has previously been counseled on alternative treatments, and the decision by the patient and provider was to proceed with the procedure listed  above.  For all procedures, there are risks of bleeding, infection, damage to surrounding organs including but not limited to bowel, bladder, blood vessels, ureters and nerves, and need for further surgery if an injury were to occur. These risks are all low with minimally invasive surgery.   There are risks of numbness and weakness at any body site or buttock/rectal pain.  It is possible that baseline pain can be worsened by surgery, either with or without mesh. If surgery is vaginal, there is also a low risk of possible conversion to laparoscopy or open abdominal incision where indicated. Very rare risks include blood transfusion, blood clot, heart attack, pneumonia, or death.   There is also a risk of short-term postoperative urinary retention with need to use a catheter. About half of patients need to go home from surgery with a catheter, which is then later removed in the office. The risk of long-term need for a catheter is very low. There is also a risk of worsening of overactive bladder.     Prolapse (with or without mesh): Risk factors for surgical failure  include things that put pressure on your pelvis and the surgical repair, including obesity, chronic cough, and heavy lifting or straining (including lifting children or adults, straining on the toilet, or lifting heavy objects such as furniture or anything weighing >25 lbs. Risks of recurrence is 20-30% with vaginal native tissue repair and a less than 10% with sacrocolpopexy with mesh.    Sacrocolpopexy: Mesh implants may provide more prolapse support, but do have some unique risks to consider. It is important to understand that mesh is permanent and cannot be easily removed. Risks of abdominal sacrocolpopexy mesh include mesh exposure (~3-6%), painful intercourse (recent studies show lower rates after surgery compared to before, with ~5-8% risk of new onset), and very rare risks of bowel or bladder injury or infection (<1%). The risk of mesh  exposure is more likely in a woman with risks for poor healing (prior radiation, poorly controlled diabetes, or immunocompromised). The risk of new or worsened chronic pain after mesh implant is more common in women with baseline chronic pain and/or poorly controlled anxiety or depression. There is an FDA safety notification on vaginal mesh procedures for prolapse but NOT abdominal mesh  procedures and therefore does not apply to your surgery. We have extensive experience and training with mesh placement and we have close postoperative follow up to identify any potential complications from mesh.    We discussed consent for blood products. Risks for blood transfusion include allergic reactions, other reactions that can affect different body organs and managed accordingly, transmission of infectious diseases such as HIV or Hepatitis. However, the blood is screened. Patient consents for blood products.  Pre-operative instructions:  She was instructed to not take Aspirin/NSAIDs x 7days prior to surgery.  Antibiotic prophylaxis was ordered as indicated.  Catheter use: Patient will go home with foley if needed after post-operative voiding trial.  Post-operative instructions:  She was provided with specific post-operative instructions, including precautions and signs/symptoms for which we would recommend contacting us, in addition to daytime and after-hours contact phone numbers. This was provided on a handout.   Post-operative medications: Prescriptions for motrin, tylenol, miralax, and oxycodone were sent to her pharmacy. Discussed using ibuprofen and tylenol on a schedule to limit use of narcotics.   Laboratory testing:  We will check labs: type and screen   Preoperative clearance:  She does not require surgical clearance.    Post-operative follow-up:  A post-operative appointment will be made for 6 weeks from the date of surgery. If she needs a post-operative nurse visit for a voiding trial, that will  be set up after she leaves the hospital.    Patient will call the clinic or use MyChart should anything change or any new issues arise.   Jaquita Folds, MD  Time spent: I spent 20 minutes dedicated to the care of this patient on the date of this encounter to include pre-visit review of records, face-to-face time with the patient  and post visit documentation and ordering medication/ testing.

## 2022-08-09 ENCOUNTER — Other Ambulatory Visit (HOSPITAL_COMMUNITY): Payer: Self-pay

## 2022-08-09 ENCOUNTER — Encounter: Payer: Self-pay | Admitting: Obstetrics and Gynecology

## 2022-08-09 ENCOUNTER — Ambulatory Visit (INDEPENDENT_AMBULATORY_CARE_PROVIDER_SITE_OTHER): Payer: BC Managed Care – PPO | Admitting: Obstetrics and Gynecology

## 2022-08-09 VITALS — BP 146/95 | HR 75 | Wt 226.0 lb

## 2022-08-09 DIAGNOSIS — N8110 Cystocele, unspecified: Secondary | ICD-10-CM

## 2022-08-09 DIAGNOSIS — N816 Rectocele: Secondary | ICD-10-CM | POA: Diagnosis not present

## 2022-08-09 DIAGNOSIS — N993 Prolapse of vaginal vault after hysterectomy: Secondary | ICD-10-CM | POA: Diagnosis not present

## 2022-08-09 DIAGNOSIS — Z01818 Encounter for other preprocedural examination: Secondary | ICD-10-CM

## 2022-08-09 DIAGNOSIS — N812 Incomplete uterovaginal prolapse: Secondary | ICD-10-CM | POA: Diagnosis not present

## 2022-08-09 MED ORDER — ACETAMINOPHEN 500 MG PO TABS
500.0000 mg | ORAL_TABLET | Freq: Four times a day (QID) | ORAL | 0 refills | Status: AC | PRN
  Filled 2022-08-09: qty 30, 8d supply, fill #0

## 2022-08-09 MED ORDER — POLYETHYLENE GLYCOL 3350 17 GM/SCOOP PO POWD
17.0000 g | Freq: Every day | ORAL | 0 refills | Status: AC
  Filled 2022-08-09: qty 255, 15d supply, fill #0

## 2022-08-09 MED ORDER — OXYCODONE HCL 5 MG PO TABS
5.0000 mg | ORAL_TABLET | ORAL | 0 refills | Status: AC | PRN
  Filled 2022-08-09 – 2022-08-21 (×2): qty 10, 2d supply, fill #0

## 2022-08-09 MED ORDER — IBUPROFEN 600 MG PO TABS
600.0000 mg | ORAL_TABLET | Freq: Four times a day (QID) | ORAL | 0 refills | Status: AC | PRN
  Filled 2022-08-09: qty 30, 8d supply, fill #0

## 2022-08-09 NOTE — H&P (Signed)
[facility] Pre-Operative H&P  Subjective Chief Complaint: Kimberly Wall presents for a preoperative encounter.   History of Present Illness: LAKENZIE MCCLAFFERTY is a 61 y.o. female who presents for preoperative visit.  She is scheduled to undergo Exam under anesthesia, Robotic assisted sacrocolpopexy, perineorrhaphy, cystoscopy on 08/23/22.  Her symptoms include vaginal bulge, and she was was found to have Stage IV anterior, Stage IV posterior, Stage IV apical prolapse.   Urodynamic Impression:  1. Sensation was normal; capacity was normal 2. Stress Incontinence was not demonstrated. 3. Detrusor Overactivity was not demonstrated. 4. Emptying was normal with a normal PVR, a sustained detrusor contraction present,  abdominal straining not present, normal urethral sphincter activity on EMG.  Past Medical History:  Diagnosis Date   10-10-19    Allergy    seasonal   Arthritis    HANDS   Complication of anesthesia    Family history of cancer    Hypertension    Ovarian cyst    Sounds like this is a misinterpretation and she is referring to her fibroid   PONV (postoperative nausea and vomiting)    Thyroid disease    hypothyroidism     Past Surgical History:  Procedure Laterality Date   CHOLECYSTECTOMY N/A 04/19/2020   Procedure: LAPAROSCOPIC CHOLECYSTECTOMY;  Surgeon: Kinsinger, Arta Bruce, MD;  Location: [facility];  Service: General;  Laterality: N/A;   PILONIDAL CYST EXCISION  1987   ROBOTIC ASSISTED TOTAL HYSTERECTOMY WITH BILATERAL SALPINGO OOPHERECTOMY Bilateral 01/31/2018   Procedure: XI ROBOTIC ASSISTED TOTAL HYSTERECTOMY (FOR UTERUS GREATER THAN  250 GRAMS)WITH BILATERAL SALPINGO OOPHORECTOMY;  Surgeon: Everitt Amber, MD;  Location: [facility];  Service: Gynecology;  Laterality: Bilateral;    has No Known Allergies.   Family History  Problem Relation Age of Onset   Arthritis Mother    Cancer Mother        glioblastoma   Varicose Veins Mother    Arthritis Father     Diabetes Father    Hearing loss Father    Heart disease Father    Hypertension Father    Vision loss Father    Thyroid disease Sister        thyroidectomy   Breast cancer Maternal Aunt    Cancer Maternal Aunt        breast   Cancer Maternal Grandmother        throat   Varicose Veins Maternal Grandmother    Stroke Maternal Grandfather    Kidney disease Paternal Grandmother    Stroke Paternal Grandfather    Breast cancer Cousin        mat cousin, daughter of aunt with breast cancer, dx in her 76s   Breast cancer Cousin        mat first cousin, daughter of aunt without cancer, dx in her 26s   Colon cancer Cousin 62       mat cousin; son of mat uncle    Social History   Tobacco Use   Smoking status: Never   Smokeless tobacco: Never  Vaping Use   Vaping Use: Never used  Substance Use Topics   Alcohol use: Yes    Comment: 2/month   Drug use: No     Review of Systems was negative for a full 10 system review except as noted in the History of Present Illness.  No current facility-administered medications for this encounter.  Current Outpatient Medications:    acetaminophen (TYLENOL) 325 MG tablet, Take 2 tablets (650 mg total) by  mouth every 6 (six) hours as needed for moderate pain or headache., Disp: , Rfl:    acetaminophen (TYLENOL) 500 MG tablet, Take 1 tablet (500 mg total) by mouth every 6 (six) hours as needed (pain)., Disp: 30 tablet, Rfl: 0   COVID-19 mRNA Vac-TriS, Pfizer, (PFIZER-BIONT COVID-19 VAC-TRIS) SUSP injection, Inject into the muscle., Disp: 0.3 mL, Rfl: 0   enalapril (VASOTEC) 10 MG tablet, Take 1 tablet (10 mg total) by mouth daily., Disp: 90 tablet, Rfl: 3   fexofenadine (ALLEGRA) 180 MG tablet, Take 180 mg by mouth as needed for allergies or rhinitis., Disp: , Rfl:    hydrochlorothiazide (HYDRODIURIL) 25 MG tablet, Take 1 tablet (25 mg total) by mouth daily., Disp: 90 tablet, Rfl: 1   hydrochlorothiazide (HYDRODIURIL) 25 MG tablet, Take 1 tablet (25 mg  total) by mouth daily., Disp: 90 tablet, Rfl: 3   ibuprofen (ADVIL) 600 MG tablet, Take 1 tablet (600 mg total) by mouth every 6 (six) hours as needed., Disp: 30 tablet, Rfl: 0   levothyroxine (SYNTHROID) 75 MCG tablet, Take 1 tablet (75 mcg total) by mouth in the morning on an empty stomach, Disp: 90 tablet, Rfl: 3   Multiple Vitamin (MULTIVITAMIN WITH MINERALS) TABS tablet, Take 1 tablet by mouth daily., Disp: , Rfl:    nystatin (MYCOSTATIN/NYSTOP) powder, Apply 1 application topically 3 (three) times daily., Disp: 60 g, Rfl: 1   oxyCODONE (OXY IR/ROXICODONE) 5 MG immediate release tablet, Take 1 tablet (5 mg total) by mouth every 4 (four) hours as needed for severe pain., Disp: 10 tablet, Rfl: 0   polyethylene glycol powder (GLYCOLAX/MIRALAX) 17 GM/SCOOP powder, Take 17 g by mouth daily. Drink 17g (1 scoop) dissolved in water per day., Disp: 255 g, Rfl: 0   Objective There were no vitals filed for this visit.   Gen: NAD CV: S1 S2 RRR Lungs: Clear to auscultation bilaterally Abd: soft, nontender   Previous Pelvic Exam showed: POPQ (05/09/22):   POP-Q   3                                            Aa   6                                           Ba   6                                              C    6.5                                            Gh   3                                            Pb   6.5  tvl    3                                            Ap   6                                            Bp                                                  D       Assessment/ Plan  The patient is a 61 y.o. year old scheduled to undergo Exam under anesthesia, Robotic assisted sacrocolpopexy, perineorrhaphy, cystoscopy.   Jaquita Folds, MD

## 2022-08-16 ENCOUNTER — Other Ambulatory Visit (HOSPITAL_COMMUNITY): Payer: Self-pay

## 2022-08-17 ENCOUNTER — Encounter (HOSPITAL_BASED_OUTPATIENT_CLINIC_OR_DEPARTMENT_OTHER): Payer: Self-pay | Admitting: Obstetrics and Gynecology

## 2022-08-18 ENCOUNTER — Other Ambulatory Visit (HOSPITAL_COMMUNITY): Payer: Self-pay

## 2022-08-18 ENCOUNTER — Encounter (HOSPITAL_BASED_OUTPATIENT_CLINIC_OR_DEPARTMENT_OTHER): Payer: Self-pay | Admitting: Obstetrics and Gynecology

## 2022-08-18 NOTE — Progress Notes (Signed)
Spoke w/ via phone for pre-op interview--- pt Lab needs dos----  t&s, istat, ekg             Lab results------ no COVID test -----patient states asymptomatic no test needed Arrive at ------- 0630 on 08-23-2022 NPO after MN NO Solid Food.  Clear liquids from MN until--- 0530 Med rec completed Medications to take morning of surgery ----- synthroid Diabetic medication ----- n/a Patient instructed no nail polish to be worn day of surgery Patient instructed to bring photo id and insurance card day of surgery Patient aware to have Driver (ride ) / caregiver for 24 hours after surgery -- husband, mitch Patient Special Instructions ----- reviewed [facility] and visitor guidelines Pre-Op special Istructions ----- n/a Patient verbalized understanding of instructions that were given at this phone interview. Patient denies shortness of breath, chest pain, fever, cough at this phone interview.

## 2022-08-21 ENCOUNTER — Other Ambulatory Visit (HOSPITAL_COMMUNITY): Payer: Self-pay

## 2022-08-22 NOTE — Anesthesia Preprocedure Evaluation (Signed)
Anesthesia Evaluation  Patient identified by MRN, date of birth, ID band Patient awake    Reviewed: Allergy & Precautions, NPO status , Patient's Chart, lab work & pertinent test results  History of Anesthesia Complications (+) PONV and history of anesthetic complications  Airway Mallampati: I  TM Distance: >3 FB Neck ROM: Full    Dental  (+) Dental Advisory Given, Teeth Intact   Pulmonary neg pulmonary ROS   Pulmonary exam normal        Cardiovascular hypertension, Pt. on medications Normal cardiovascular exam     Neuro/Psych negative neurological ROS     GI/Hepatic negative GI ROS, Neg liver ROS,,,  Endo/Other  Hypothyroidism  Morbid obesity  Renal/GU negative Renal ROS  negative genitourinary   Musculoskeletal negative musculoskeletal ROS (+)    Abdominal   Peds  Hematology negative hematology ROS (+)   Anesthesia Other Findings   Reproductive/Obstetrics anterior vaginal prolapse; prolapse of vaginal vault after hysterectomy; stress urinary incontinence                              Anesthesia Physical Anesthesia Plan  ASA: 3  Anesthesia Plan: General   Post-op Pain Management: Tylenol PO (pre-op)* and Toradol IV (intra-op)*   Induction: Intravenous  PONV Risk Score and Plan: 4 or greater and Ondansetron, Dexamethasone, Treatment may vary due to age or medical condition, Midazolam and Scopolamine patch - Pre-op  Airway Management Planned: Oral ETT  Additional Equipment: None  Intra-op Plan:   Post-operative Plan: Extubation in OR  Informed Consent: I have reviewed the patients History and Physical, chart, labs and discussed the procedure including the risks, benefits and alternatives for the proposed anesthesia with the patient or authorized representative who has indicated his/her understanding and acceptance.     Dental advisory given  Plan Discussed with:    Anesthesia Plan Comments:         Anesthesia Quick Evaluation

## 2022-08-23 ENCOUNTER — Ambulatory Visit (HOSPITAL_BASED_OUTPATIENT_CLINIC_OR_DEPARTMENT_OTHER)
Admission: RE | Admit: 2022-08-23 | Discharge: 2022-08-23 | Disposition: A | Discharge status: Home / Self Care | Payer: BC Managed Care – PPO | Attending: Obstetrics and Gynecology | Admitting: Obstetrics and Gynecology

## 2022-08-23 ENCOUNTER — Encounter (HOSPITAL_BASED_OUTPATIENT_CLINIC_OR_DEPARTMENT_OTHER)
Admission: RE | Disposition: A | Discharge status: Home / Self Care | Payer: Self-pay | Source: Home / Self Care | Attending: Obstetrics and Gynecology

## 2022-08-23 ENCOUNTER — Ambulatory Visit (HOSPITAL_BASED_OUTPATIENT_CLINIC_OR_DEPARTMENT_OTHER): Payer: BC Managed Care – PPO | Admitting: Anesthesiology

## 2022-08-23 ENCOUNTER — Encounter (HOSPITAL_BASED_OUTPATIENT_CLINIC_OR_DEPARTMENT_OTHER): Payer: Self-pay | Admitting: Obstetrics and Gynecology

## 2022-08-23 ENCOUNTER — Other Ambulatory Visit: Payer: Self-pay

## 2022-08-23 ENCOUNTER — Telehealth: Payer: Self-pay | Admitting: Obstetrics and Gynecology

## 2022-08-23 DIAGNOSIS — Z79899 Other long term (current) drug therapy: Secondary | ICD-10-CM | POA: Insufficient documentation

## 2022-08-23 DIAGNOSIS — N819 Female genital prolapse, unspecified: Secondary | ICD-10-CM

## 2022-08-23 DIAGNOSIS — N8111 Cystocele, midline: Secondary | ICD-10-CM | POA: Diagnosis not present

## 2022-08-23 DIAGNOSIS — N813 Complete uterovaginal prolapse: Secondary | ICD-10-CM

## 2022-08-23 DIAGNOSIS — Z90710 Acquired absence of both cervix and uterus: Secondary | ICD-10-CM | POA: Diagnosis not present

## 2022-08-23 DIAGNOSIS — E039 Hypothyroidism, unspecified: Secondary | ICD-10-CM | POA: Diagnosis not present

## 2022-08-23 DIAGNOSIS — I10 Essential (primary) hypertension: Secondary | ICD-10-CM | POA: Diagnosis not present

## 2022-08-23 DIAGNOSIS — N993 Prolapse of vaginal vault after hysterectomy: Secondary | ICD-10-CM | POA: Diagnosis not present

## 2022-08-23 DIAGNOSIS — Z6841 Body Mass Index (BMI) 40.0 and over, adult: Secondary | ICD-10-CM | POA: Insufficient documentation

## 2022-08-23 DIAGNOSIS — Z01818 Encounter for other preprocedural examination: Secondary | ICD-10-CM

## 2022-08-23 HISTORY — DX: Presence of spectacles and contact lenses: Z97.3

## 2022-08-23 HISTORY — PX: ROBOTIC ASSISTED LAPAROSCOPIC SACROCOLPOPEXY: SHX5388

## 2022-08-23 HISTORY — DX: Prolapse of vaginal vault after hysterectomy: N99.3

## 2022-08-23 HISTORY — DX: Other seasonal allergic rhinitis: J30.2

## 2022-08-23 HISTORY — DX: Cystocele, unspecified: N81.10

## 2022-08-23 HISTORY — DX: Hypothyroidism, unspecified: E03.9

## 2022-08-23 HISTORY — DX: Frequency of micturition: R35.0

## 2022-08-23 HISTORY — PX: PERINEOPLASTY: SHX2218

## 2022-08-23 HISTORY — PX: CYSTOSCOPY: SHX5120

## 2022-08-23 LAB — POCT I-STAT, CHEM 8
BUN: 15 mg/dL (ref 6–20)
Calcium, Ion: 1.12 mmol/L — ABNORMAL LOW (ref 1.15–1.40)
Chloride: 105 mmol/L (ref 98–111)
Creatinine, Ser: 0.6 mg/dL (ref 0.44–1.00)
Glucose, Bld: 118 mg/dL — ABNORMAL HIGH (ref 70–99)
HCT: 41 % (ref 36.0–46.0)
Hemoglobin: 13.9 g/dL (ref 12.0–15.0)
Potassium: 4.2 mmol/L (ref 3.5–5.1)
Sodium: 142 mmol/L (ref 135–145)
TCO2: 28 mmol/L (ref 22–32)

## 2022-08-23 LAB — TYPE AND SCREEN
ABO/RH(D): A POS
Antibody Screen: NEGATIVE

## 2022-08-23 SURGERY — SACROCOLPOPEXY, ROBOT-ASSISTED, LAPAROSCOPIC
Anesthesia: General | Site: Perineum

## 2022-08-23 MED ORDER — GABAPENTIN 300 MG PO CAPS
300.0000 mg | ORAL_CAPSULE | ORAL | Status: AC
  Administered 2022-08-23: 300 mg via ORAL

## 2022-08-23 MED ORDER — WATER FOR IRRIGATION, STERILE IR SOLN
Status: DC | PRN
  Administered 2022-08-23: 500 mL

## 2022-08-23 MED ORDER — PHENAZOPYRIDINE HCL 100 MG PO TABS
200.0000 mg | ORAL_TABLET | ORAL | Status: AC
  Administered 2022-08-23: 200 mg via ORAL

## 2022-08-23 MED ORDER — OXYCODONE HCL 5 MG/5ML PO SOLN: 5.0000 mg | Freq: Once | ORAL | Status: AC | PRN

## 2022-08-23 MED ORDER — ACETAMINOPHEN 500 MG PO TABS
1000.0000 mg | ORAL_TABLET | ORAL | Status: AC
  Administered 2022-08-23: 1000 mg via ORAL

## 2022-08-23 MED ORDER — MIDAZOLAM HCL 2 MG/2ML IJ SOLN
INTRAMUSCULAR | Status: AC
  Filled 2022-08-23: qty 2

## 2022-08-23 MED ORDER — KETOROLAC TROMETHAMINE 30 MG/ML IJ SOLN
INTRAMUSCULAR | Status: AC
  Filled 2022-08-23: qty 1

## 2022-08-23 MED ORDER — LIDOCAINE-EPINEPHRINE 1 %-1:100000 IJ SOLN
INTRAMUSCULAR | Status: DC | PRN
  Administered 2022-08-23: 9 mL

## 2022-08-23 MED ORDER — OXYCODONE HCL 5 MG PO TABS
ORAL_TABLET | ORAL | Status: AC
  Filled 2022-08-23: qty 1

## 2022-08-23 MED ORDER — LIDOCAINE HCL (PF) 2 % IJ SOLN
INTRAMUSCULAR | Status: AC
  Filled 2022-08-23: qty 5

## 2022-08-23 MED ORDER — PHENAZOPYRIDINE HCL 100 MG PO TABS
ORAL_TABLET | ORAL | Status: AC
  Filled 2022-08-23: qty 2

## 2022-08-23 MED ORDER — FENTANYL CITRATE (PF) 100 MCG/2ML IJ SOLN
INTRAMUSCULAR | Status: DC | PRN
  Administered 2022-08-23: 50 ug via INTRAVENOUS
  Administered 2022-08-23: 100 ug via INTRAVENOUS
  Administered 2022-08-23: 50 ug via INTRAVENOUS

## 2022-08-23 MED ORDER — SUGAMMADEX SODIUM 500 MG/5ML IV SOLN
INTRAVENOUS | Status: AC
  Filled 2022-08-23: qty 5

## 2022-08-23 MED ORDER — KETOROLAC TROMETHAMINE 30 MG/ML IJ SOLN
30.0000 mg | Freq: Four times a day (QID) | INTRAMUSCULAR | Status: DC
  Administered 2022-08-23: 30 mg via INTRAVENOUS

## 2022-08-23 MED ORDER — AMISULPRIDE (ANTIEMETIC) 5 MG/2ML IV SOLN: 10.0000 mg | Freq: Once | INTRAVENOUS | Status: DC | PRN

## 2022-08-23 MED ORDER — ONDANSETRON HCL 4 MG/2ML IJ SOLN
INTRAMUSCULAR | Status: AC
  Filled 2022-08-23: qty 2

## 2022-08-23 MED ORDER — CEFAZOLIN SODIUM-DEXTROSE 2-4 GM/100ML-% IV SOLN
INTRAVENOUS | Status: AC
  Filled 2022-08-23: qty 100

## 2022-08-23 MED ORDER — DEXMEDETOMIDINE HCL IN NACL 80 MCG/20ML IV SOLN
INTRAVENOUS | Status: AC
  Filled 2022-08-23: qty 20

## 2022-08-23 MED ORDER — PHENYLEPHRINE 80 MCG/ML (10ML) SYRINGE FOR IV PUSH (FOR BLOOD PRESSURE SUPPORT)
PREFILLED_SYRINGE | INTRAVENOUS | Status: DC | PRN
  Administered 2022-08-23: 120 ug via INTRAVENOUS
  Administered 2022-08-23: 80 ug via INTRAVENOUS
  Administered 2022-08-23 (×2): 120 ug via INTRAVENOUS
  Administered 2022-08-23: 40 ug via INTRAVENOUS

## 2022-08-23 MED ORDER — ROCURONIUM BROMIDE 10 MG/ML (PF) SYRINGE
PREFILLED_SYRINGE | INTRAVENOUS | Status: DC | PRN
  Administered 2022-08-23: 70 mg via INTRAVENOUS
  Administered 2022-08-23: 20 mg via INTRAVENOUS

## 2022-08-23 MED ORDER — ROCURONIUM BROMIDE 10 MG/ML (PF) SYRINGE
PREFILLED_SYRINGE | INTRAVENOUS | Status: AC
  Filled 2022-08-23: qty 20

## 2022-08-23 MED ORDER — PHENYLEPHRINE HCL (PRESSORS) 10 MG/ML IV SOLN
INTRAVENOUS | Status: AC
  Filled 2022-08-23: qty 1

## 2022-08-23 MED ORDER — LIDOCAINE 2% (20 MG/ML) 5 ML SYRINGE
INTRAMUSCULAR | Status: DC | PRN
  Administered 2022-08-23: 100 mg via INTRAVENOUS

## 2022-08-23 MED ORDER — GABAPENTIN 300 MG PO CAPS
ORAL_CAPSULE | ORAL | Status: AC
  Filled 2022-08-23: qty 1

## 2022-08-23 MED ORDER — SCOPOLAMINE 1 MG/3DAYS TD PT72
1.0000 | MEDICATED_PATCH | TRANSDERMAL | Status: DC
  Administered 2022-08-23: 1.5 mg via TRANSDERMAL

## 2022-08-23 MED ORDER — PHENYLEPHRINE 80 MCG/ML (10ML) SYRINGE FOR IV PUSH (FOR BLOOD PRESSURE SUPPORT)
PREFILLED_SYRINGE | INTRAVENOUS | Status: AC
  Filled 2022-08-23: qty 10

## 2022-08-23 MED ORDER — LACTATED RINGERS IV SOLN: INTRAVENOUS | Status: DC

## 2022-08-23 MED ORDER — POVIDONE-IODINE 10 % EX SWAB: 2.0000 | Freq: Once | CUTANEOUS | Status: DC

## 2022-08-23 MED ORDER — CEFAZOLIN SODIUM-DEXTROSE 2-4 GM/100ML-% IV SOLN
2.0000 g | INTRAVENOUS | Status: AC
  Administered 2022-08-23: 2 g via INTRAVENOUS

## 2022-08-23 MED ORDER — DEXMEDETOMIDINE HCL IN NACL 80 MCG/20ML IV SOLN
INTRAVENOUS | Status: DC | PRN
  Administered 2022-08-23: 12 ug via BUCCAL

## 2022-08-23 MED ORDER — ONDANSETRON HCL 4 MG/2ML IJ SOLN: 4.0000 mg | Freq: Once | INTRAMUSCULAR | Status: DC | PRN

## 2022-08-23 MED ORDER — DEXAMETHASONE SODIUM PHOSPHATE 10 MG/ML IJ SOLN
INTRAMUSCULAR | Status: DC | PRN
  Administered 2022-08-23: 5 mg via INTRAVENOUS

## 2022-08-23 MED ORDER — SODIUM CHLORIDE 0.9 % IR SOLN
Status: DC | PRN
  Administered 2022-08-23: 300 mL
  Administered 2022-08-23: 200 mL via INTRAVESICAL

## 2022-08-23 MED ORDER — FENTANYL CITRATE (PF) 100 MCG/2ML IJ SOLN: 25.0000 ug | INTRAMUSCULAR | Status: DC | PRN

## 2022-08-23 MED ORDER — PHENYLEPHRINE HCL-NACL 20-0.9 MG/250ML-% IV SOLN
INTRAVENOUS | Status: DC | PRN
  Administered 2022-08-23: 25 ug/min via INTRAVENOUS

## 2022-08-23 MED ORDER — FENTANYL CITRATE (PF) 250 MCG/5ML IJ SOLN
INTRAMUSCULAR | Status: AC
  Filled 2022-08-23: qty 5

## 2022-08-23 MED ORDER — ACETAMINOPHEN 500 MG PO TABS
ORAL_TABLET | ORAL | Status: AC
  Filled 2022-08-23: qty 2

## 2022-08-23 MED ORDER — MIDAZOLAM HCL 5 MG/5ML IJ SOLN
INTRAMUSCULAR | Status: DC | PRN
  Administered 2022-08-23: 2 mg via INTRAVENOUS

## 2022-08-23 MED ORDER — PROPOFOL 10 MG/ML IV BOLUS
INTRAVENOUS | Status: AC
  Filled 2022-08-23: qty 20

## 2022-08-23 MED ORDER — OXYCODONE HCL 5 MG PO TABS
5.0000 mg | ORAL_TABLET | Freq: Once | ORAL | Status: AC | PRN
  Administered 2022-08-23: 5 mg via ORAL

## 2022-08-23 MED ORDER — SCOPOLAMINE 1 MG/3DAYS TD PT72
MEDICATED_PATCH | TRANSDERMAL | Status: AC
  Filled 2022-08-23: qty 1

## 2022-08-23 MED ORDER — SUGAMMADEX SODIUM 200 MG/2ML IV SOLN
INTRAVENOUS | Status: DC | PRN
  Administered 2022-08-23: 200 mg via INTRAVENOUS

## 2022-08-23 MED ORDER — BUPIVACAINE HCL (PF) 0.25 % IJ SOLN
INTRAMUSCULAR | Status: DC | PRN
  Administered 2022-08-23: 10 mL

## 2022-08-23 MED ORDER — PROPOFOL 10 MG/ML IV BOLUS
INTRAVENOUS | Status: DC | PRN
  Administered 2022-08-23: 140 mg via INTRAVENOUS

## 2022-08-23 MED ORDER — IBUPROFEN 200 MG PO TABS: 600.0000 mg | ORAL_TABLET | Freq: Four times a day (QID) | ORAL | Status: DC

## 2022-08-23 MED ORDER — EPHEDRINE SULFATE (PRESSORS) 50 MG/ML IJ SOLN
INTRAMUSCULAR | Status: DC | PRN
  Administered 2022-08-23: 10 mg via INTRAVENOUS

## 2022-08-23 SURGICAL SUPPLY — 94 items
ADH SKN CLS APL DERMABOND .7 (GAUZE/BANDAGES/DRESSINGS) ×4
AGENT HMST KT MTR STRL THRMB (HEMOSTASIS)
APL ESCP 34 STRL LF DISP (HEMOSTASIS)
APL PRP STRL LF DISP 70% ISPRP (MISCELLANEOUS) ×4
APPLICATOR SURGIFLO ENDO (HEMOSTASIS) IMPLANT
BLADE CLIPPER SENSICLIP SURGIC (BLADE) ×5 IMPLANT
BLADE SURG 15 STRL LF DISP TIS (BLADE) ×5 IMPLANT
BLADE SURG 15 STRL SS (BLADE) ×4
CATH FOLEY 3WAY  5CC 16FR (CATHETERS) ×4
CATH FOLEY 3WAY 5CC 16FR (CATHETERS) ×5 IMPLANT
CHLORAPREP W/TINT 26 (MISCELLANEOUS) ×5 IMPLANT
COVER BACK TABLE 60X90IN (DRAPES) ×5 IMPLANT
COVER TIP SHEARS 8 DVNC (MISCELLANEOUS) ×5 IMPLANT
COVER TIP SHEARS 8MM DA VINCI (MISCELLANEOUS) ×4
DEFOGGER SCOPE WARMER CLEARIFY (MISCELLANEOUS) ×5 IMPLANT
DERMABOND ADVANCED .7 DNX12 (GAUZE/BANDAGES/DRESSINGS) ×5 IMPLANT
DEVICE CAPIO SLIM SINGLE (INSTRUMENTS) IMPLANT
DRAPE ARM DVNC X/XI (DISPOSABLE) ×20 IMPLANT
DRAPE COLUMN DVNC XI (DISPOSABLE) ×5 IMPLANT
DRAPE DA VINCI XI ARM (DISPOSABLE) ×16
DRAPE DA VINCI XI COLUMN (DISPOSABLE) ×4
DRAPE SHEET LG 3/4 BI-LAMINATE (DRAPES) ×5 IMPLANT
DRAPE UTILITY XL STRL (DRAPES) ×5 IMPLANT
ELECT REM PT RETURN 9FT ADLT (ELECTROSURGICAL) ×4
ELECTRODE REM PT RTRN 9FT ADLT (ELECTROSURGICAL) ×5 IMPLANT
GAUZE 4X4 16PLY ~~LOC~~+RFID DBL (SPONGE) ×10 IMPLANT
GLOVE BIO SURGEON STRL SZ 6 (GLOVE) ×15 IMPLANT
GLOVE BIOGEL PI IND STRL 6.5 (GLOVE) ×20 IMPLANT
GLOVE BIOGEL PI IND STRL 7.0 (GLOVE) ×10 IMPLANT
GLOVE ECLIPSE 6.0 STRL STRAW (GLOVE) ×5 IMPLANT
GOWN STRL REUS W/TWL LRG LVL3 (GOWN DISPOSABLE) ×5 IMPLANT
HIBICLENS CHG 4% 4OZ BTL (MISCELLANEOUS) ×5 IMPLANT
HOLDER FOLEY CATH W/STRAP (MISCELLANEOUS) ×5 IMPLANT
IRRIG SUCT STRYKERFLOW 2 WTIP (MISCELLANEOUS) ×4
IRRIGATION SUCT STRKRFLW 2 WTP (MISCELLANEOUS) ×5 IMPLANT
IV NS 1000ML (IV SOLUTION) ×8
IV NS 1000ML BAXH (IV SOLUTION) ×2 IMPLANT
KIT TURNOVER CYSTO (KITS) ×5 IMPLANT
LEGGING LITHOTOMY PAIR STRL (DRAPES) ×5 IMPLANT
MANIFOLD NEPTUNE II (INSTRUMENTS) ×5 IMPLANT
MANIPULATOR ADVINCU DEL 2.5 PL (MISCELLANEOUS) IMPLANT
MANIPULATOR ADVINCU DEL 3.0 PL (MISCELLANEOUS) IMPLANT
MANIPULATOR ADVINCU DEL 3.5 PL (MISCELLANEOUS) IMPLANT
MANIPULATOR ADVINCU DEL 4.0 PL (MISCELLANEOUS) IMPLANT
MESH VERTESSA LITE -Y 2X4X3 (Mesh General) ×5 IMPLANT
NDL INSUFFLATION 14GA 120MM (NEEDLE) ×4 IMPLANT
NEEDLE HYPO 22GX1.5 SAFETY (NEEDLE) ×5 IMPLANT
NEEDLE INSUFFLATION 14GA 120MM (NEEDLE) ×4 IMPLANT
NS IRRIG 1000ML POUR BTL (IV SOLUTION) ×5 IMPLANT
OBTURATOR OPTICAL STANDARD 8MM (TROCAR) ×4
OBTURATOR OPTICAL STND 8 DVNC (TROCAR) ×4
OBTURATOR OPTICALSTD 8 DVNC (TROCAR) ×5 IMPLANT
PACK CYSTO (CUSTOM PROCEDURE TRAY) ×5 IMPLANT
PACK ROBOT WH (CUSTOM PROCEDURE TRAY) ×5 IMPLANT
PACK ROBOTIC GOWN (GOWN DISPOSABLE) ×5 IMPLANT
PACK VAGINAL WOMENS (CUSTOM PROCEDURE TRAY) ×5 IMPLANT
PAD OB MATERNITY 4.3X12.25 (PERSONAL CARE ITEMS) ×5 IMPLANT
PAD POSITIONING PINK XL (MISCELLANEOUS) ×5 IMPLANT
PAD PREP 24X48 CUFFED NSTRL (MISCELLANEOUS) ×5 IMPLANT
POUCH LAPAROSCOPIC INSTRUMENT (MISCELLANEOUS) IMPLANT
PROTECTOR NERVE ULNAR (MISCELLANEOUS) ×5 IMPLANT
RETRACTOR LONE STAR DISPOSABLE (INSTRUMENTS) ×5 IMPLANT
RETRACTOR STAY HOOK 5MM (MISCELLANEOUS) ×5 IMPLANT
SEAL CANN UNIV 5-8 DVNC XI (MISCELLANEOUS) ×25 IMPLANT
SEAL XI 5MM-8MM UNIVERSAL (MISCELLANEOUS) ×20
SEALER VESSEL DA VINCI XI (MISCELLANEOUS)
SEALER VESSEL EXT DVNC XI (MISCELLANEOUS) IMPLANT
SET IRRIG Y TYPE TUR BLADDER L (SET/KITS/TRAYS/PACK) ×5 IMPLANT
SET TUBE SMOKE EVAC HIGH FLOW (TUBING) ×5 IMPLANT
SPIKE FLUID TRANSFER (MISCELLANEOUS) ×10 IMPLANT
SPONGE T-LAP 4X18 ~~LOC~~+RFID (SPONGE) IMPLANT
SUCTION FRAZIER HANDLE 10FR (MISCELLANEOUS) ×4
SUCTION TUBE FRAZIER 10FR DISP (MISCELLANEOUS) ×5 IMPLANT
SURGIFLO W/THROMBIN 8M KIT (HEMOSTASIS) IMPLANT
SUT ABS MONO DBL WITH NDL 48IN (SUTURE) IMPLANT
SUT GORETEX NAB #0 THX26 36IN (SUTURE) ×5 IMPLANT
SUT MNCRL AB 4-0 PS2 18 (SUTURE) ×6 IMPLANT
SUT MON AB 2-0 SH 27 (SUTURE) ×5 IMPLANT
SUT V-LOC BARB 180 2/0GR6 GS22 (SUTURE)
SUT V-LOC BARB 180 2/0GR9 GS23 (SUTURE) ×8
SUT VIC AB 0 CT1 27 (SUTURE) ×12
SUT VIC AB 0 CT1 27XBRD ANTBC (SUTURE) ×11 IMPLANT
SUT VIC AB 2-0 SH 27 (SUTURE) ×8
SUT VIC AB 2-0 SH 27XBRD (SUTURE) ×6 IMPLANT
SUT VIC AB 3-0 SH 18 (SUTURE) IMPLANT
SUT VICRYL 2-0 SH 8X27 (SUTURE) IMPLANT
SUT VLOC 180 0 9IN  GS21 (SUTURE)
SUT VLOC 180 0 9IN GS21 (SUTURE) IMPLANT
SUT VLOC 180 2-0 9IN GS21 (SUTURE) IMPLANT
SUTURE V-LC BRB 180 2/0GR6GS22 (SUTURE) IMPLANT
SUTURE V-LC BRB 180 2/0GR9GS23 (SUTURE) ×10 IMPLANT
SYR BULB EAR ULCER 3OZ GRN STR (SYRINGE) ×5 IMPLANT
TOWEL OR 17X26 10 PK STRL BLUE (TOWEL DISPOSABLE) ×5 IMPLANT
TRAY FOLEY W/BAG SLVR 14FR (SET/KITS/TRAYS/PACK) ×5 IMPLANT

## 2022-08-23 NOTE — Telephone Encounter (Signed)
Camas underwent Robotic assisted sacrocolpopexy, perineorrhaphy and cystoscopy on 08/23/22.   She passed her voiding trial.  28m was backfilled into the bladder Voided 1552m PVR by bladder scan was 58m72m  She was discharged without a catheter. Please call her for a routine post op check. Thanks!  MicJaquita FoldsD

## 2022-08-23 NOTE — Progress Notes (Signed)
Dr. Wannetta Sender aware of bladder installation of 200 ml NS. Foley removed and voided 150 ml. Bladder scan revealed 0 ml PVR. OK to go home.

## 2022-08-23 NOTE — Transfer of Care (Signed)
Immediate Anesthesia Transfer of Care Note  Patient: Kimberly Wall  Procedure(s) Performed: XI ROBOTIC ASSISTED LAPAROSCOPIC SACROCOLPOPEXY (Pelvis) PERINEOPLASTY (Perineum) CYSTOSCOPY (Bladder)  Patient Location: PACU  Anesthesia Type:General  Level of Consciousness: awake and patient cooperative  Airway & Oxygen Therapy: Patient Spontanous Breathing and Patient connected to nasal cannula oxygen  Post-op Assessment: Report given to RN and Post -op Vital signs reviewed and stable  Post vital signs: Reviewed and stable  Last Vitals:  Vitals Value Taken Time  BP 115/80 08/23/22 1130  Temp 36.3 C 08/23/22 1129  Pulse 73 08/23/22 1131  Resp 13 08/23/22 1131  SpO2 95 % 08/23/22 1131  Vitals shown include unvalidated device data.  Last Pain:  Vitals:   08/23/22 0746  TempSrc: Oral  PainSc: 0-No pain      Patients Stated Pain Goal: 6 (23/30/07 6226)  Complications: No notable events documented.

## 2022-08-23 NOTE — Anesthesia Postprocedure Evaluation (Signed)
Anesthesia Post Note  Patient: Kimberly Wall  Procedure(s) Performed: XI ROBOTIC ASSISTED LAPAROSCOPIC SACROCOLPOPEXY (Pelvis) PERINEOPLASTY (Perineum) CYSTOSCOPY (Bladder)     Patient location during evaluation: PACU Anesthesia Type: General Level of consciousness: awake and alert Pain management: pain level controlled Vital Signs Assessment: post-procedure vital signs reviewed and stable Respiratory status: spontaneous breathing, nonlabored ventilation and respiratory function stable Cardiovascular status: blood pressure returned to baseline and stable Postop Assessment: no apparent nausea or vomiting Anesthetic complications: no   No notable events documented.  Last Vitals:  Vitals:   08/23/22 1230 08/23/22 1245  BP: 105/84 110/77  Pulse: 68 65  Resp: 11 11  Temp:    SpO2: 95% 98%    Last Pain:  Vitals:   08/23/22 1230  TempSrc:   PainSc: 0-No pain                 Lidia Collum

## 2022-08-23 NOTE — Discharge Instructions (Addendum)
POST OPERATIVE INSTRUCTIONS  General Instructions Recovery (not bed rest) will last approximately 6 weeks Walking is encouraged, but refrain from strenuous exercise/ housework/ heavy lifting. No lifting >10lbs  Nothing in the vagina- NO intercourse, tampons or douching Bathing:  Do not submerge in water (NO swimming, bath, hot tub, etc) until after your postop visit. You can shower starting the day after surgery.  No driving until you are not taking narcotic pain medicine and until your pain is well enough controlled that you can slam on the breaks or make sudden movements if needed.   Taking your medications Please take your acetaminophen and ibuprofen on a schedule for the first 48 hours. Take 600mg ibuprofen, then take 500mg acetaminophen 3 hours later, then continue to alternate ibuprofen and acetaminophen. That way you are taking each type of medication every 6 hours. Take the prescribed narcotic (oxycodone, tramadol, etc) as needed, with a maximum being every 4 hours.  Take a stool softener daily to keep your stools soft and preventing you from straining. If you have diarrhea, you decrease your stool softener. This is explained more below. We have prescribed you Miralax.  Reasons to Call the Nurse (see last page for phone numbers) Heavy Bleeding (changing your pad every 1-2 hours) Persistent nausea/vomiting Fever (100.4 degrees or more) Incision problems (pus or other fluid coming out, redness, warmth, increased pain)  Things to Expect After Surgery Mild to Moderate pain is normal during the first day or two after surgery. If prescribed, take Ibuprofen or Tylenol first and use the stronger medicine for "break-through" pain. You can overlap these medicines because they work differently.   Constipation   To Prevent Constipation:  Eat a well-balanced diet including protein, grains, fresh fruit and vegetables.  Drink plenty of fluids. Walk regularly.  Depending on specific instructions  from your physician: take Miralax daily and additionally you can add a stool softener (colace/ docusate) and fiber supplement. Continue as long as you're on pain medications.   To Treat Constipation:  If you do not have a bowel movement in 2 days after surgery, you can take 2 Tbs of Milk of Magnesia 1-2 times a day until you have a bowel movement. If diarrhea occurs, decrease the amount or stop the laxative. If no results with Milk of Magnesia, you can drink a bottle of magnesium citrate which you can purchase over the counter.  Fatigue:  This is a normal response to surgery and will improve with time.  Plan frequent rest periods throughout the day.  Gas Pain:  This is very common but can also be very painful! Drink warm liquids such as herbal teas, bouillon or soup. Walking will help you pass more gas.  Mylicon or Gas-X can be taken over the counter.  Leaking Urine:  Varying amounts of leakage Vilda occur after surgery.  This should improve with time. Your bladder needs at least 3 months to recover from surgery. If you leak after surgery, be sure to mention this to your doctor at your post-op visit. If you were taking medications for overactive bladder prior to surgery, be sure to restart the medications immediately after surgery.  Incisions: If you have incisions on your abdomen, the skin glue will dissolve on its own over time. It is ok to gently rinse with soap and water over these incisions but do not scrub.  Catheter Approximately 50% of patients are unable to urinate after surgery and need to go home with a catheter. This allows your bladder to   rest so it can return to full function. If you go home with a catheter, the office will call to set up a voiding trial a few days after surgery. For most patients, by this visit, they are able to urinate on their own. Long term catheter use is rare.   Return to Work  As work demands and recovery times vary widely, it is hard to predict when you will want  to return to work. If you have a desk job with no strenuous physical activity, and if you would like to return sooner than generally recommended, discuss this with your provider or call our office.   Post op concerns  For non-emergent issues, please call the Urogynecology Nurse. Please leave a message and someone will contact you within one business day.  You can also send a message through [facility].   AFTER HOURS (After 5:00 PM and on weekends):  For urgent matters that cannot wait until the next business day. Call our office [phone] and connect to the doctor on call.  Please reserve this for important issues.   **FOR ANY TRUE EMERGENCY ISSUES CALL 911 OR GO TO THE NEAREST EMERGENCY ROOM.** Please inform our office or the doctor on call of any emergency.     APPOINTMENTS: Call [phone]    Post Anesthesia Home Care Instructions  Activity: Get plenty of rest for the remainder of the day. A responsible individual must stay with you for 24 hours following the procedure.  For the next 24 hours, DO NOT: -Drive a car -Paediatric nurse -Drink alcoholic beverages -Take any medication unless instructed by your physician -Make any legal decisions or sign important papers.  Meals: Start with liquid foods such as gelatin or soup. Progress to regular foods as tolerated. Avoid greasy, spicy, heavy foods. If nausea and/or vomiting occur, drink only clear liquids until the nausea and/or vomiting subsides. Call your physician if vomiting continues.  Special Instructions/Symptoms: Your throat may feel dry or sore from the anesthesia or the breathing tube placed in your throat during surgery. If this causes discomfort, gargle with warm salt water. The discomfort should disappear within 24 hours.  If you had a scopolamine patch placed behind your ear for the management of post- operative nausea and/or vomiting:  1. The medication in the patch is effective for 72 hours, after which it should  be removed.  Wrap patch in a tissue and discard in the trash. Wash hands thoroughly with soap and water. 2. You may remove the patch earlier than 72 hours if you experience unpleasant side effects which may include dry mouth, dizziness or visual disturbances. 3. Avoid touching the patch. Wash your hands with soap and water after contact with the patch.    Remove patch behind left ear by Saturday, August 26, 2022.

## 2022-08-23 NOTE — Anesthesia Procedure Notes (Signed)
Procedure Name: Intubation Date/Time: 08/23/2022 8:47 AM  Performed by: Bonney Aid, CRNAPre-anesthesia Checklist: Patient identified, Emergency Drugs available, Suction available and Patient being monitored Patient Re-evaluated:Patient Re-evaluated prior to induction Oxygen Delivery Method: Circle system utilized Preoxygenation: Pre-oxygenation with 100% oxygen Induction Type: IV induction Ventilation: Mask ventilation without difficulty Laryngoscope Size: Mac and 3 Grade View: Grade I Tube type: Oral Tube size: 7.0 mm Number of attempts: 1 Airway Equipment and Method: Stylet Placement Confirmation: ETT inserted through vocal cords under direct vision, positive ETCO2 and breath sounds checked- equal and bilateral Secured at: 19 cm Tube secured with: Tape Dental Injury: Teeth and Oropharynx as per pre-operative assessment

## 2022-08-23 NOTE — Op Note (Signed)
Operative Note  Preoperative Diagnosis: anterior vaginal prolapse, posterior vaginal prolapse, and vaginal vault prolapse after hysterectomy  Postoperative Diagnosis: same  Procedures performed:  Robotic assisted sacrocolpopexy, perineorrhaphy, cystoscopy  Implants:  Implant Name Type Inv. Item Serial No. Manufacturer Lot No. LRB No. Used Action  MESH Valli Glance [id number] [employer] [place] [id number] [id number] N/A 1 Implanted    Attending Surgeon: Sherlene Shams, MD  Anesthesia: General endotracheal  Findings: 1. On vaginal exam. Stage IV prolapse noted  2. On laparoscopy, minimal adhesions of bladder peritoneum to the bowel   3. On cystoscopy, normal bladder and urethra without injury, lesion or foreign body. Brisk bilateral ureteral efflux noted.   Specimens: none  Estimated blood loss: 50 mL  IV fluids: 1800 mL  Urine output: 308 mL  Complications: none  Procedure in Detail:  After informed consent was obtained, the patient was taken to the operating room, where general anesthesia was induced and found to be adequate. She was placed in dorsolithotomy position in yellowfin stirrups. Her hips were noted not to be hyperflexed or hyperextended. Her arms were padded with gel pads and tucked to her sides.  A padded strap was placed across her chest with foam between the pad and her skin. Shoulder supports were also placed. She was noted to be appropriately positioned with all pressure points well padded and off tension. A tilt test showed minimal slippage. She was prepped and draped in the usual sterile fashion.  A sterile Foley catheter was inserted.   0.25% plain Marcaine was injected at the umbilicus and an incision was made with a scalpel. A Veress needle was inserted into the incision, CO2 insufflation was started, a low opening pressure was noted, and pneumoperitoneum was obtained. The Veress needle was removed and a 71m robotic  trocar was placed. Entry into the peritoneal cavity was confirmed. After determining placement for the other ports, Local anesthetic was injected at each site and two 8 mm incisions were made for robotic ports at 10 cm lateral to and at the level of the umbilical port. Two additional 8 mm incisions were made 10 cm lateral to these and 30 degrees down followed by 8 mm robotic ports - the right side for an assistant port. All trocars were placed sequentially under direct visualization of the camera. The patient was placed in Trendelenburg. The sacrum appeared to be free of any adhesive disease.The robot was docked on the patient's right side. Monopolar endoshears were placed in the right arm, a [place] bipolar grasper was placed in the 2nd arm of the patient's left side, and a Tip up grasper was placed in the 3rd arm on the patient's left side.    With a lucite probe in the vagina, anterior vaginal dissection was then performed with sharp dissection and electrosurgery. The peritoneum only minimally covered the proximal vagina. The peritoneum was dissected away.  The posterior vaginal dissection was then performed with sharp dissection and electrosurgery in order to dissect the rectum away from the posterior vagina. The  Attention was then turned to the sacral promontory. The peritoneum overlying the sacral promontory was tented up, dissected sharply with monopolar scissors and electrosurgery using layer by layer technique. The overlying areolar and adipose tissue were taken down until the anterior longitudinal sacral ligament was identified. The peritoneal incision was extended down to the posterior cul-de-sac. This was performed with care to avoid the ureter on the right side and the sigmoid colon  and its mesentary on the left side.   Small vessels were cauterized along the way to obtain excellent hemostasis. A "Y" mesh was then inserted into the abdomen after trimming to appropriate size. With the probe in the  vagina, the anterior leaf of the Y mesh was affixed to the anterior portion of the vagina using a 2-0 v-loc suture in a spiral pattern to distribute the suture evenly across the surface of the anterior mesh leaf. In a similar fashion, the posterior leaf of the Y mesh was attached to the posterior surface of the vagina with 2-0 v-loc suture.  The distal end of the mesh was then brought to overlie the sacrum. The correct amount of tension was determined in order to elevate the vagina, but not put the mesh under tension. The distal end of the mesh was then affixed to the anterior longitudinal sacral ligament using two interrupted transverse stitches of CV2 Gortex. The excess distal mesh was then cut and removed. The peritoneum was reapproximated over the mesh using 2-0 monocryl. The bladder flap was incorporated to completely retroperitonealize the mesh. All pedicles were carefully inspected and noted to be hemostatic as the CO2 gas was deflated. All instruments were removed from the patient's abdomen.    The Foley catheter was removed.  A 70-degree cystoscope was introduced, and 360-degree inspection revealed no injury, lesion or foreign body in the bladder. Brisk bilateral ureteral efflux was noted with the assistance of pyridium.  The bladder was drained and the cystoscope was removed.  The Foley catheter was replaced.   The robot was undocked. The CO2 gas was removed and the ports were removed.  The skin incisions were closed with subcutaneous stitches of 4-0 Monocryl and covered with skin glue.   Attention was then turned to the perineum. Two allis clamps were placed at the introitus. The perineum was injected with 1% lidocaine with epinephrine. A diamond shaped incision was made over the perineum and excess skin was removed. Dissection was performed with Metzenbaum scissors to separate the mucosa from the underlying tissue. The perineal body was then reapproximated with two interrupted 0-vicryl sutures. The  perineal skin was then closed with a 2-0 vicryl in a subcutaneous and subcuticular fashion. Irrigation was performed and good hemostasis was noted.   The patient tolerated the procedure well. Sponge, lap, and needle counts were correct x 2. She was awakened from anesthesia and transferred to the recovery room in stable condition.   Jaquita Folds, MD

## 2022-08-23 NOTE — Interval H&P Note (Signed)
History and Physical Interval Note:  08/23/2022 8:08 AM  Kimberly Wall  has presented today for surgery, with the diagnosis of anterior vaginal prolapse; prolapse of vaginal vault after hysterectomy; stress urinary incontinence.  The various methods of treatment have been discussed with the patient and family. After consideration of risks, benefits and other options for treatment, the patient has consented to  Procedure(s) with comments: XI ROBOTIC ASSISTED LAPAROSCOPIC SACROCOLPOPEXY (N/A) - total time requested is 3.5hrs PERINEOPLASTY (N/A) CYSTOSCOPY (N/A) as a surgical intervention.  The patient's history has been reviewed, patient examined, no change in status, stable for surgery.  I have reviewed the patient's chart and labs.  Questions were answered to the patient's satisfaction.     Jaquita Folds

## 2022-08-25 ENCOUNTER — Encounter (HOSPITAL_BASED_OUTPATIENT_CLINIC_OR_DEPARTMENT_OTHER): Payer: Self-pay | Admitting: Obstetrics and Gynecology

## 2022-08-25 NOTE — Telephone Encounter (Signed)
Post- Op Call  Lilia Argue underwent  Robotic assisted sacrocolpopexy, perineorrhaphy and cystoscopy on 08/23/22 with Dr Wannetta Sender. The patient reports that her pain is controlled. She is taking Advil and Tylenol. She denies vaginal bleeding. She has had a bowel movement and is taking stool softner for a bowel regimen. She was discharged without a catheter.  Pt has her follow up apt scheduled and knows to call us if she has any concerns or questions.    Ambrose Finland, CMA

## 2022-08-28 ENCOUNTER — Telehealth: Payer: Self-pay | Admitting: Obstetrics and Gynecology

## 2022-08-28 ENCOUNTER — Other Ambulatory Visit (HOSPITAL_COMMUNITY): Payer: Self-pay

## 2022-08-28 MED ORDER — LIDOCAINE-PRILOCAINE 2.5-2.5 % EX CREA
1.0000 | TOPICAL_CREAM | CUTANEOUS | 0 refills | Status: AC | PRN
  Filled 2022-08-28: qty 30, 30d supply, fill #0

## 2022-08-28 MED ORDER — LIDOCAINE HCL URETHRAL/MUCOSAL 2 % EX GEL
1.0000 | CUTANEOUS | 0 refills | Status: DC | PRN
  Filled 2022-08-28: qty 30, 30d supply, fill #0

## 2022-08-28 NOTE — Telephone Encounter (Signed)
Called patient and she reports some irritation at the surgical site in the perineum area. We discussed that this is most likely the sutures healing into place. She reports she would like something topical she can use for when the irritation is too much. Discussed a topical lidocaine that can be used in small amounts to the area and to wash hands before and after application.

## 2022-08-28 NOTE — Addendum Note (Signed)
Addended by: Berton Mount on: 08/28/2022 04:21 PM   Modules accepted: Orders

## 2022-08-28 NOTE — Telephone Encounter (Signed)
Patient called this morning requesting something topical to be called in for the "surgical area".  She said she is very irritated in that area and very uncomfortable.

## 2022-08-29 ENCOUNTER — Encounter: Payer: Self-pay | Admitting: *Deleted

## 2022-10-06 ENCOUNTER — Encounter: Payer: Self-pay | Admitting: Obstetrics and Gynecology

## 2022-10-06 ENCOUNTER — Encounter: Payer: BC Managed Care – PPO | Admitting: Obstetrics and Gynecology

## 2022-10-06 ENCOUNTER — Ambulatory Visit (INDEPENDENT_AMBULATORY_CARE_PROVIDER_SITE_OTHER): Payer: BC Managed Care – PPO | Admitting: Obstetrics and Gynecology

## 2022-10-06 VITALS — BP 186/87 | HR 73

## 2022-10-06 DIAGNOSIS — Z9889 Other specified postprocedural states: Secondary | ICD-10-CM

## 2022-10-06 NOTE — Telephone Encounter (Signed)
Error

## 2022-10-06 NOTE — Progress Notes (Signed)
[facility]  Date of Visit: 10/06/2022  History of Present Illness: Kimberly Wall is a 61 y.o. female scheduled today for a post-operative visit.   Surgery: s/p Robotic assisted sacrocolpopexy, perineorrhaphy, cystoscopy on 08/23/22  She passed her postoperative void trial.   Postoperative course has been uncomplicated. She was prescribed emla cream to use on the perineum due to irritation.   Today she reports she is feeling well overall.   UTI in the last 6 weeks? No  Pain? No . Has not needed the emla cream recently, only used it twice.  She has returned to her normal activity (except for postop restrictions) Vaginal bulge? No  Stress incontinence: No  Urgency/frequency: No  Urge incontinence: No  Voiding dysfunction: No  Bowel issues: No   Subjective Success: Do you usually have a bulge or something falling out that you can see or feel in the vaginal area? No  Retreatment Success: Any retreatment with surgery or pessary for any compartment? No    Medications: She has a current medication list which includes the following prescription(s): acetaminophen, enalapril, fexofenadine, hydrochlorothiazide, ibuprofen, levothyroxine, lidocaine-prilocaine, multivitamin with minerals, nystatin, oxycodone, and polyethylene glycol powder.   Allergies: Patient has No Known Allergies.   Physical Exam: BP (!) 186/87   Pulse 73   LMP 11/13/2017   Abdomen: soft, non-tender, without masses or organomegaly Laparoscopic Incisions: healing well. intact Pelvic Examination: Vagina: Normal vaginal mucosa. No tenderness along the anterior or posterior vagina. No apical tenderness. No pelvic masses. No visible or palpable mesh.  POP-Q: POP-Q  -3                                            Aa   -3                                           Ba  -8                                              C   3                                            Gh  5                                             Pb  8                                            tvl   -2                                            Ap  -2  Bp                                                 D     ---------------------------------------------------------  Assessment and Plan:  1. Post-operative state    - Well healed.  - Can resume regular activity including exercise and intercourse,  if desired.  - Discussed avoidance of heavy lifting and straining long term to reduce the risk of recurrence.   Follow up as needed  Jaquita Folds, MD

## 2022-10-06 NOTE — Progress Notes (Deleted)
[facility]  Date of Visit: 10/06/2022  History of Present Illness: Kimberly Wall is a 61 y.o. female scheduled today for a post-operative visit.   Surgery: s/p Robotic assisted sacrocolpopexy, perineorrhaphy, cystoscopy on 08/23/22  She passed her postoperative void trial.   Postoperative course has been uncomplicated. Was prescribed emla cream for perineal discomfort.   Today she reports ***  UTI in the last 6 weeks? {YES/NO:21197} Pain? {YES/NO:21197} She {HAS HAS VPX:10626} returned to her normal activity (except for postop restrictions) Vaginal bulge? {YES/NO:21197} Stress incontinence: {YES/NO:21197} Urgency/frequency: {YES/NO:21197} Urge incontinence: {YES/NO:21197} Voiding dysfunction: {YES/NO:21197} Bowel issues: {YES/NO:21197}  Subjective Success: Do you usually have a bulge or something falling out that you can see or feel in the vaginal area? {YES/NO:21197} Retreatment Success: Any retreatment with surgery or pessary for any compartment? {YES/NO:21197}  Pathology results: ***  Medications: She has a current medication list which includes the following prescription(s): acetaminophen, enalapril, fexofenadine, hydrochlorothiazide, ibuprofen, levothyroxine, lidocaine-prilocaine, multivitamin with minerals, nystatin, oxycodone, and polyethylene glycol powder.   Allergies: Patient has No Known Allergies.   Physical Exam: LMP 11/13/2017   Abdomen: {Exam; abdomen:14935::"soft, non-tender, without masses or organomegaly"} ***Incisions: {Exam; incision:13523}.  Pelvic Examination: Vagina: Incisions {Exam; incision:13523}. Sutures are {DESC; PRESENT/NOT PRESENT:21021351} at incision line and there {ACTION; IS/IS [id number]} granulation tissue. No tenderness along the anterior or posterior vagina. No apical tenderness. No pelvic masses. ***No visible or palpable mesh.  POP-Q:     No data to display          ---------------------------------------------------------  Assessment and Plan: No diagnosis found.  - ***Pathology results were reviewed with the patient today and she verbalized understanding that the results were ***benign.  - ***The patient was given a copy of her operative note ***and pathology report*** for her records. - Can resume regular activity including exercise and intercourse,  if desired.  - Discussed avoidance of heavy lifting and straining long term to reduce the risk of recurrance.   All questions answered.   No follow-ups on file.

## 2022-10-20 DIAGNOSIS — M25461 Effusion, right knee: Secondary | ICD-10-CM | POA: Diagnosis not present

## 2022-11-01 ENCOUNTER — Other Ambulatory Visit (HOSPITAL_COMMUNITY): Payer: Self-pay

## 2022-11-01 ENCOUNTER — Other Ambulatory Visit: Payer: Self-pay | Admitting: Sports Medicine

## 2022-11-01 ENCOUNTER — Ambulatory Visit
Admission: RE | Admit: 2022-11-01 | Discharge: 2022-11-01 | Disposition: A | Discharge status: Home / Self Care | Payer: BC Managed Care – PPO | Source: Ambulatory Visit | Attending: Sports Medicine | Admitting: Sports Medicine

## 2022-11-01 DIAGNOSIS — M25561 Pain in right knee: Secondary | ICD-10-CM

## 2022-11-01 DIAGNOSIS — M1711 Unilateral primary osteoarthritis, right knee: Secondary | ICD-10-CM | POA: Diagnosis not present

## 2022-11-01 MED ORDER — MELOXICAM 15 MG PO TABS
15.0000 mg | ORAL_TABLET | Freq: Every day | ORAL | 0 refills | Status: AC | PRN
  Filled 2022-11-01: qty 14, 14d supply, fill #0

## 2022-11-22 ENCOUNTER — Other Ambulatory Visit (HOSPITAL_COMMUNITY): Payer: Self-pay

## 2022-11-28 DIAGNOSIS — M1711 Unilateral primary osteoarthritis, right knee: Secondary | ICD-10-CM | POA: Diagnosis not present

## 2022-12-08 DIAGNOSIS — E039 Hypothyroidism, unspecified: Secondary | ICD-10-CM | POA: Diagnosis not present

## 2022-12-08 DIAGNOSIS — I10 Essential (primary) hypertension: Secondary | ICD-10-CM | POA: Diagnosis not present

## 2022-12-08 DIAGNOSIS — Z13220 Encounter for screening for lipoid disorders: Secondary | ICD-10-CM | POA: Diagnosis not present

## 2022-12-08 DIAGNOSIS — Z0000 Encounter for general adult medical examination without abnormal findings: Secondary | ICD-10-CM | POA: Diagnosis not present

## 2022-12-08 DIAGNOSIS — Z131 Encounter for screening for diabetes mellitus: Secondary | ICD-10-CM | POA: Diagnosis not present

## 2023-01-03 ENCOUNTER — Other Ambulatory Visit (HOSPITAL_COMMUNITY): Payer: Self-pay

## 2023-01-04 ENCOUNTER — Other Ambulatory Visit (HOSPITAL_COMMUNITY): Payer: Self-pay

## 2023-01-04 MED ORDER — HYDROCHLOROTHIAZIDE 25 MG PO TABS
25.0000 mg | ORAL_TABLET | Freq: Every day | ORAL | 3 refills | Status: DC
  Filled 2023-01-04 – 2023-04-10 (×2): qty 90, 90d supply, fill #0
  Filled 2023-07-16: qty 90, 90d supply, fill #1
  Filled 2023-10-21: qty 90, 90d supply, fill #2

## 2023-01-11 ENCOUNTER — Other Ambulatory Visit (HOSPITAL_COMMUNITY): Payer: Self-pay

## 2023-01-21 ENCOUNTER — Ambulatory Visit (HOSPITAL_COMMUNITY)
Admission: EM | Admit: 2023-01-21 | Discharge: 2023-01-21 | Disposition: A | Discharge status: Home / Self Care | Payer: BC Managed Care – PPO | Attending: Internal Medicine | Admitting: Internal Medicine

## 2023-01-21 ENCOUNTER — Encounter (HOSPITAL_COMMUNITY): Payer: Self-pay

## 2023-01-21 ENCOUNTER — Ambulatory Visit (INDEPENDENT_AMBULATORY_CARE_PROVIDER_SITE_OTHER): Payer: BC Managed Care – PPO

## 2023-01-21 DIAGNOSIS — W19XXXA Unspecified fall, initial encounter: Secondary | ICD-10-CM

## 2023-01-21 DIAGNOSIS — S93602A Unspecified sprain of left foot, initial encounter: Secondary | ICD-10-CM

## 2023-01-21 DIAGNOSIS — M7989 Other specified soft tissue disorders: Secondary | ICD-10-CM | POA: Diagnosis not present

## 2023-01-21 DIAGNOSIS — S93402A Sprain of unspecified ligament of left ankle, initial encounter: Secondary | ICD-10-CM

## 2023-01-21 DIAGNOSIS — M25572 Pain in left ankle and joints of left foot: Secondary | ICD-10-CM

## 2023-01-21 MED ORDER — IBUPROFEN 800 MG PO TABS
ORAL_TABLET | ORAL | Status: AC
  Filled 2023-01-21: qty 1

## 2023-01-21 MED ORDER — IBUPROFEN 800 MG PO TABS
800.0000 mg | ORAL_TABLET | Freq: Once | ORAL | Status: AC
  Administered 2023-01-21: 800 mg via ORAL

## 2023-01-21 NOTE — ED Provider Notes (Signed)
[facility]    CSN: [id number] Arrival date & time: 01/21/23  1001      History   Chief Complaint Chief Complaint  Patient presents with   Foot Pain    HPI Kimberly Wall is a 62 y.o. female.   Patient presents to urgent care for evaluation of pain and swelling to the dorsal aspect of the left foot near the first and second metatarsals as well as pain and swelling to the left lateral ankle after she suffered a fall yesterday afternoon.  She states she was in the yard doing some yard work and accidentally tripped over a tree root.  She fell forward landing on her knees but states she struck her left ankle/left foot to the tree branch.  She began to have pain and swelling immediately after falling to the left foot/ankle.  Denies previous injury to the left foot/ankle.  No numbness or tingling distally to injury.  No laceration, abrasion, or ecchymosis to the left foot/ankle.  Has been using Tylenol and ibuprofen as needed for swelling and pain without much relief.   Foot Pain    Past Medical History:  Diagnosis Date   Arthritis    hands   Complication of anesthesia    Family history of cancer    Frequency of urination    Hypertension    Hypothyroidism    followed by pcp   PONV (postoperative nausea and vomiting)    Prolapse of vaginal cuff after hysterectomy    Prolapse of vaginal wall    anterior and posterior   Seasonal allergies    Wears contact lenses     Patient Active Problem List   Diagnosis Date Noted   Vaginal vault prolapse after hysterectomy 08/23/2022   Hypothyroidism 06/28/2020   Class 2 obesity 12/28/2019   Genetic testing 01/21/2018   Family history of breast cancer    Family history of colon cancer    Family history of brain cancer    Subserous leiomyoma of uterus 01/09/2018   Fibroid 12/13/2017   Complete uterovaginal prolapse 12/13/2017   Essential hypertension 10/15/2017    Past Surgical History:  Procedure Laterality Date    CHOLECYSTECTOMY N/A 04/19/2020   Procedure: LAPAROSCOPIC CHOLECYSTECTOMY;  Surgeon: Rodman Pickle, MD;  Location: [facility];  Service: General;  Laterality: N/A;   CYSTOSCOPY N/A 08/23/2022   Procedure: CYSTOSCOPY;  Surgeon: Marguerita Beards, MD;  Location: [facility];  Service: Gynecology;  Laterality: N/A;   PERINEOPLASTY N/A 08/23/2022   Procedure: PERINEOPLASTY;  Surgeon: Marguerita Beards, MD;  Location: [facility];  Service: Gynecology;  Laterality: N/A;   PILONIDAL CYST EXCISION  1987   ROBOTIC ASSISTED LAPAROSCOPIC SACROCOLPOPEXY N/A 08/23/2022   Procedure: XI ROBOTIC ASSISTED LAPAROSCOPIC SACROCOLPOPEXY;  Surgeon: Marguerita Beards, MD;  Location: [facility];  Service: Gynecology;  Laterality: N/A;  total time requested is 3.5hrs   ROBOTIC ASSISTED TOTAL HYSTERECTOMY WITH BILATERAL SALPINGO OOPHERECTOMY Bilateral 01/31/2018   Procedure: XI ROBOTIC ASSISTED TOTAL HYSTERECTOMY (FOR UTERUS GREATER THAN  250 GRAMS)WITH BILATERAL SALPINGO OOPHORECTOMY;  Surgeon: Adolphus Birchwood, MD;  Location: [facility];  Service: Gynecology;  Laterality: Bilateral;   TONSILLECTOMY     child    OB History     Gravida  3   Para  3   Term  0   Preterm  0   AB  0   Living  3      SAB  0   IAB  0   Ectopic  0   Multiple  0   Live Births  3            Home Medications    Prior to Admission medications   Medication Sig Start Date End Date Taking? Authorizing Provider  acetaminophen (TYLENOL) 500 MG tablet Take 1 tablet (500 mg total) by mouth every 6 (six) hours as needed (pain). 08/09/22  Yes Marguerita Beards, MD  enalapril (VASOTEC) 10 MG tablet Take 1 tablet (10 mg total) by mouth daily. Patient taking differently: Take 10 mg by mouth at bedtime. 02/09/22  Yes   hydrochlorothiazide (HYDRODIURIL) 25 MG tablet Take 1 tablet (25 mg total) by mouth daily. 01/04/23  Yes   ibuprofen (ADVIL) 600 MG tablet Take 1 tablet (600  mg total) by mouth every 6 (six) hours as needed. 08/09/22  Yes Marguerita Beards, MD  levothyroxine (SYNTHROID) 75 MCG tablet Take 1 tablet (75 mcg total) by mouth in the morning on an empty stomach Patient taking differently: Take 75 mcg by mouth in the morning. 04/03/22  Yes   Multiple Vitamin (MULTIVITAMIN WITH MINERALS) TABS tablet Take 1 tablet by mouth daily.   Yes [provider]  fexofenadine (ALLEGRA) 180 MG tablet Take 180 mg by mouth as needed for allergies or rhinitis.    [provider]  lidocaine-prilocaine (EMLA) cream Apply 1 Application topically as needed. 08/28/22   Selmer Dominion, NP  meloxicam (MOBIC) 15 MG tablet Take 1 tablet (15 mg total) by mouth daily as needed for pain for 14 days 11/01/22     nystatin (MYCOSTATIN/NYSTOP) powder Apply 1 application topically 3 (three) times daily. Patient taking differently: Apply 1 application  topically 3 (three) times daily as needed. 06/28/20   [place], Velna Hatchet, MD  oxyCODONE (OXY IR/ROXICODONE) 5 MG immediate release tablet Take 1 tablet (5 mg total) by mouth every 4 (four) hours as needed for severe pain. Patient not taking: Reported on 08/18/2022 08/09/22   Marguerita Beards, MD  polyethylene glycol powder [facility]) 17 GM/SCOOP powder Take 17 g by mouth daily. Drink 17g (1 scoop) dissolved in water per day. Patient not taking: Reported on 08/18/2022 08/09/22   Marguerita Beards, MD    Family History Family History  Problem Relation Age of Onset   Arthritis Mother    Cancer Mother        glioblastoma   Varicose Veins Mother    Arthritis Father    Diabetes Father    Hearing loss Father    Heart disease Father    Hypertension Father    Vision loss Father    Thyroid disease Sister        thyroidectomy   Breast cancer Maternal Aunt    Cancer Maternal Aunt        breast   Cancer Maternal Grandmother        throat   Varicose Veins Maternal Grandmother    Stroke Maternal Grandfather     Kidney disease Paternal Grandmother    Stroke Paternal Grandfather    Breast cancer Cousin        mat cousin, daughter of aunt with breast cancer, dx in her 77s   Breast cancer Cousin        mat first cousin, daughter of aunt without cancer, dx in her 75s   Colon cancer Cousin 24       mat cousin; son of mat uncle    Social History Social History   Tobacco  Use   Smoking status: Never   Smokeless tobacco: Never  Vaping Use   Vaping Use: Never used  Substance Use Topics   Alcohol use: Yes    Comment: social   Drug use: Never     Allergies   Patient has no known allergies.   Review of Systems Review of Systems Per HPI  Physical Exam Triage Vital Signs ED Triage Vitals  Enc Vitals Group     BP 01/21/23 1019 134/83     Pulse Rate 01/21/23 1019 75     Resp 01/21/23 1019 16     Temp 01/21/23 1019 98.2 F (36.8 C)     Temp Source 01/21/23 1019 Oral     SpO2 01/21/23 1019 96 %     Weight 01/21/23 1017 210 lb (95.3 kg)     Height 01/21/23 1017  (1.6 m)     Head Circumference --      Peak Flow --      Pain Score 01/21/23 1017 5     Pain Loc --      Pain Edu? --      Excl. in GC? --    No data found.  Updated Vital Signs BP 134/83 (BP Location: Left Arm)   Pulse 75   Temp 98.2 F (36.8 C) (Oral)   Resp 16   Ht  (1.6 m)   Wt 210 lb (95.3 kg)   LMP 11/13/2017   SpO2 96%   BMI 37.20 kg/m   Visual Acuity Right Eye Distance:   Left Eye Distance:   Bilateral Distance:    Right Eye Near:   Left Eye Near:    Bilateral Near:     Physical Exam Vitals and nursing note reviewed.  Constitutional:      Appearance: She is not ill-appearing or toxic-appearing.  HENT:     Head: Normocephalic and atraumatic.     Right Ear: Hearing and external ear normal.     Left Ear: Hearing and external ear normal.     Nose: Nose normal.     Mouth/Throat:     Lips: Pink.  Eyes:     General: Lids are normal. Vision grossly intact. Gaze aligned appropriately.      Extraocular Movements: Extraocular movements intact.     Conjunctiva/sclera: Conjunctivae normal.  Pulmonary:     Effort: Pulmonary effort is normal.  Musculoskeletal:     Cervical back: Neck supple.     Left ankle: Swelling (Diffuse to the lateral malleolus) present. No deformity, ecchymosis or lacerations. Tenderness present over the lateral malleolus (Point tender to the inferior lateral malleolus). Decreased range of motion (Secondary to pain). Normal pulse (+2 posterior tibialis).     Left Achilles Tendon: Normal.     Left foot: Normal range of motion and normal capillary refill (Cap refill is less than 2). Swelling (Swelling to the overlying soft tissue of the first and second metatarsals as well as the first and second MTP joints), tenderness (Diffuse tenderness to palpation over the first and second metatarsals and wrist and second MTP joints) and bony tenderness present. No deformity, bunion, Charcot foot, foot drop, prominent metatarsal heads, laceration or crepitus. Normal pulse (+2 dorsalis pedis pulse).     Comments: Strength and sensation intact distal to injury of left foot.  Skin:    General: Skin is warm and dry.     Capillary Refill: Capillary refill takes less than 2 seconds.     Findings: No rash.  Neurological:  General: No focal deficit present.     Mental Status: She is alert and oriented to person, place, and time. Mental status is at baseline.     Cranial Nerves: No dysarthria or facial asymmetry.  Psychiatric:        Mood and Affect: Mood normal.        Speech: Speech normal.        Behavior: Behavior normal.        Thought Content: Thought content normal.        Judgment: Judgment normal.      UC Treatments / Results  Labs (all labs ordered are listed, but only abnormal results are displayed) Labs Reviewed - No data to display  EKG   Radiology DG Ankle Complete Left  Result Date: 01/21/2023 CLINICAL DATA:  62 year old female status post fall.  Increased pain with weight-bearing, swelling. EXAM: LEFT ANKLE COMPLETE - 3+ VIEW COMPARISON:  Left foot series today reported separately. FINDINGS: Bone mineralization is within normal limits for age. Maintained mortise joint alignment. No evidence of ankle joint effusion. Os trigonum, normal variant. Talar dome intact. No distal tibia or fibula fracture identified. Discontinuous dorsal calcaneus osteophyte as described on the foot series today. No obvious soft tissue swelling there. Talus appears intact. Other visible bones of the left foot appear intact. IMPRESSION: No acute fracture or dislocation identified about the left ankle. Electronically Signed   By: Odessa Fleming M.D.   On: 01/21/2023 11:19   DG Foot Complete Left  Result Date: 01/21/2023 CLINICAL DATA:  62 year old female status post fall. Increased pain with weight-bearing, swelling. EXAM: LEFT FOOT - COMPLETE 3+ VIEW COMPARISON:  Left ankle series reported separately. FINDINGS: Calcaneus degenerative spurring with discontinuous but chronic appearing ossification at the dorsal Achilles insertion. Calcaneus otherwise intact. Joint spaces and alignment maintained throughout the left foot. No acute fracture or dislocation identified in the foot. Possible generalized soft tissue swelling. No soft tissue gas. IMPRESSION: No acute fracture or dislocation identified about the left foot. Electronically Signed   By: Odessa Fleming M.D.   On: 01/21/2023 11:18    Procedures Procedures (including critical care time)  Medications Ordered in UC Medications  ibuprofen (ADVIL) tablet 800 mg (800 mg Oral Given 01/21/23 1049)    Initial Impression / Assessment and Plan / UC Course  I have reviewed the triage vital signs and the nursing notes.  Pertinent labs & imaging results that were available during my care of the patient were reviewed by me and considered in my medical decision making (see chart for details).   1.  Fall, sprain of left foot, sprain of left  ankle X-rays are negative for fracture or dislocation.  Will manage this with supportive care.  Ibuprofen 800 mg every 8 hours as needed for pain and inflammation.  RICE advised.  Crutches and Ace wrap provided in clinic as patient is experiencing significant tenderness with weightbearing.  Follow-up with [facility] as needed for ongoing evaluation.  Strict ER and urgent care return precautions discussed.  Neurovascularly intact distal to injury.  Discussed physical exam and available lab work findings in clinic with patient.  Counseled patient regarding appropriate use of medications and potential side effects for all medications recommended or prescribed today. Discussed red flag signs and symptoms of worsening condition,when to call the PCP office, return to urgent care, and when to seek higher level of care in the emergency department. Patient verbalizes understanding and agreement with plan. All questions answered. Patient discharged in  stable condition.    Final Clinical Impressions(s) / UC Diagnoses   Final diagnoses:  Sprain of left foot, initial encounter  Sprain of left ankle, unspecified ligament, initial encounter  Fall, initial encounter     Discharge Instructions      Your x-rays of your ankle and foot were negative for fracture or dislocation. You likely sprained your ankle/foot.   Purchase an ankle brace over-the-counter or wear the Ace wrap for the next couple of weeks to provide compression, stability, and comfort.  Please rest, ice, and elevate your ankle/foot to help it heal and decrease inflammation.   Take 800 mg ibuprofen every 8 hours as needed for pain and inflammation.  Call the orthopedic provider listed on your discharge paperwork to schedule a follow-up appointment if your symptoms do not improve in the next 1-2 weeks with supportive care.  Return to urgent care if you experience worsening pain, numbness, tingling, change of color in your skin  near the injury, or any other concerning symptoms.  I hope you feel better!     ED Prescriptions   None    PDMP not reviewed this encounter.   Carlisle Beers, [place] 01/21/23 1142

## 2023-01-21 NOTE — Discharge Instructions (Signed)
Your x-rays of your ankle and foot were negative for fracture or dislocation. You likely sprained your ankle/foot.   Purchase an ankle brace over-the-counter or wear the Ace wrap for the next couple of weeks to provide compression, stability, and comfort.  Please rest, ice, and elevate your ankle/foot to help it heal and decrease inflammation.   Take 800 mg ibuprofen every 8 hours as needed for pain and inflammation.  Call the orthopedic provider listed on your discharge paperwork to schedule a follow-up appointment if your symptoms do not improve in the next 1-2 weeks with supportive care.  Return to urgent care if you experience worsening pain, numbness, tingling, change of color in your skin near the injury, or any other concerning symptoms.  I hope you feel better!

## 2023-01-21 NOTE — ED Triage Notes (Signed)
Patient here today for an injury to her left foot. Increased pain with weightbearing. She is swollen. Patient was walking and tripped over a tree root and fell forward. Her left foot hit the tree root. She has taken Tylenol and IBU which helps some with the pain.

## 2023-01-30 ENCOUNTER — Ambulatory Visit (INDEPENDENT_AMBULATORY_CARE_PROVIDER_SITE_OTHER): Payer: Self-pay | Admitting: Physician Assistant

## 2023-01-30 ENCOUNTER — Other Ambulatory Visit (HOSPITAL_COMMUNITY): Payer: Self-pay

## 2023-01-30 VITALS — BP 130/80 | HR 78 | Temp 98.5°F | Resp 16 | Wt 213.0 lb

## 2023-01-30 DIAGNOSIS — H109 Unspecified conjunctivitis: Secondary | ICD-10-CM

## 2023-01-30 MED ORDER — POLYMYXIN B-TRIMETHOPRIM 10000-0.1 UNIT/ML-% OP SOLN
1.0000 [drp] | Freq: Four times a day (QID) | OPHTHALMIC | 0 refills | Status: AC
  Filled 2023-01-30: qty 10, 7d supply, fill #0

## 2023-01-30 NOTE — Progress Notes (Signed)
[facility] [address] [place], [place] [address]   Office Visit Note  Patient Name: Kimberly Wall Date of Birth [id number]  Medical Record number [id number]  Date of Service: 01/30/2023  Chief Complaint  Patient presents with   Eye Problem     62 y/o F presents to the clinic for c/o R eye pain, swelling x 2 days. +discharge. +slight tearing. Not sensitive to light. Denies known exposure to pink eye. +use of contact lens. Has not changed eye makeup recently. Denies URI symptoms.  Eye Problem  Associated symptoms include an eye discharge and eye redness. Pertinent negatives include no itching or photophobia.      Current Medication:  Outpatient Encounter Medications as of 01/30/2023  Medication Sig   trimethoprim-polymyxin b (POLYTRIM) ophthalmic solution Place 1-2 drops into the right eye every 6 (six) hours.   acetaminophen (TYLENOL) 500 MG tablet Take 1 tablet (500 mg total) by mouth every 6 (six) hours as needed (pain).   enalapril (VASOTEC) 10 MG tablet Take 1 tablet (10 mg total) by mouth daily. (Patient taking differently: Take 10 mg by mouth at bedtime.)   fexofenadine (ALLEGRA) 180 MG tablet Take 180 mg by mouth as needed for allergies or rhinitis.   hydrochlorothiazide (HYDRODIURIL) 25 MG tablet Take 1 tablet (25 mg total) by mouth daily.   ibuprofen (ADVIL) 600 MG tablet Take 1 tablet (600 mg total) by mouth every 6 (six) hours as needed.   levothyroxine (SYNTHROID) 75 MCG tablet Take 1 tablet (75 mcg total) by mouth in the morning on an empty stomach (Patient taking differently: Take 75 mcg by mouth in the morning.)   lidocaine-prilocaine (EMLA) cream Apply 1 Application topically as needed.   meloxicam (MOBIC) 15 MG tablet Take 1 tablet (15 mg total) by mouth daily as needed for pain for 14 days   Multiple Vitamin (MULTIVITAMIN WITH MINERALS) TABS tablet Take 1 tablet by mouth daily.   nystatin (MYCOSTATIN/NYSTOP) powder Apply 1 application topically 3 (three)  times daily. (Patient taking differently: Apply 1 application  topically 3 (three) times daily as needed.)   oxyCODONE (OXY IR/ROXICODONE) 5 MG immediate release tablet Take 1 tablet (5 mg total) by mouth every 4 (four) hours as needed for severe pain. (Patient not taking: Reported on 08/18/2022)   polyethylene glycol powder (GLYCOLAX/MIRALAX) 17 GM/SCOOP powder Take 17 g by mouth daily. Drink 17g (1 scoop) dissolved in water per day. (Patient not taking: Reported on 08/18/2022)   No facility-administered encounter medications on file as of 01/30/2023.      Medical History: Past Medical History:  Diagnosis Date   Arthritis    hands   Complication of anesthesia    Family history of cancer    Frequency of urination    Hypertension    Hypothyroidism    followed by pcp   PONV (postoperative nausea and vomiting)    Prolapse of vaginal cuff after hysterectomy    Prolapse of vaginal wall    anterior and posterior   Seasonal allergies    Wears contact lenses      Vital Signs: LMP 11/13/2017    Review of Systems  Constitutional: Negative.   HENT: Negative.    Eyes:  Positive for pain, discharge and redness. Negative for photophobia, itching and visual disturbance.  Neurological: Negative.     Physical Exam Constitutional:      Appearance: Normal appearance.  HENT:     Head: Atraumatic.     Right Ear: Tympanic membrane,  ear canal and external ear normal.     Left Ear: Tympanic membrane, ear canal and external ear normal.     Nose: Nose normal.     Mouth/Throat:     Mouth: Mucous membranes are moist.     Pharynx: Oropharynx is clear.  Eyes:     General:        Right eye: Discharge present. No foreign body.     Extraocular Movements: Extraocular movements intact.     Right eye: Normal extraocular motion.     Left eye: Normal extraocular motion.     Conjunctiva/sclera:     Right eye: Right conjunctiva is injected.     Comments: No uptake of fluorescein stain in the right  eye.   Cardiovascular:     Rate and Rhythm: Normal rate and regular rhythm.  Pulmonary:     Effort: Pulmonary effort is normal.     Breath sounds: Normal breath sounds.  Musculoskeletal:     Cervical back: Neck supple.  Skin:    General: Skin is warm.  Neurological:     Mental Status: She is alert.  Psychiatric:        Mood and Affect: Mood normal.        Behavior: Behavior normal.        Thought Content: Thought content normal.        Judgment: Judgment normal.       Assessment/Plan:  1. Bacterial conjunctivitis of right eye - trimethoprim-polymyxin b (POLYTRIM) ophthalmic solution; Place 1-2 drops into the right eye every 6 (six) hours.  Dispense: 10 mL; Refill: 0  Apply cold compressed to the affected eye Use eye drops as prescribed Continue to watch for worsening symptoms Do not use contact lens until symptoms completely resolved.  Pt verbalized understanding and in agreement.   General Counseling: osie merkin understanding of the findings of todays visit and agrees with plan of treatment. I have discussed any further diagnostic evaluation that may be needed or ordered today. We also reviewed her medications today. she has been encouraged to call the office with any questions or concerns that should arise related to todays visit.    Time spent:20 Minutes    Gilberto Better, [place] Physician Assistant

## 2023-02-08 DIAGNOSIS — I10 Essential (primary) hypertension: Secondary | ICD-10-CM | POA: Diagnosis not present

## 2023-02-27 ENCOUNTER — Other Ambulatory Visit (HOSPITAL_COMMUNITY): Payer: Self-pay

## 2023-02-27 DIAGNOSIS — D225 Melanocytic nevi of trunk: Secondary | ICD-10-CM | POA: Diagnosis not present

## 2023-02-27 DIAGNOSIS — L812 Freckles: Secondary | ICD-10-CM | POA: Diagnosis not present

## 2023-02-27 DIAGNOSIS — L821 Other seborrheic keratosis: Secondary | ICD-10-CM | POA: Diagnosis not present

## 2023-02-27 DIAGNOSIS — L218 Other seborrheic dermatitis: Secondary | ICD-10-CM | POA: Diagnosis not present

## 2023-02-27 MED ORDER — HYDROCORTISONE 2.5 % EX CREA
TOPICAL_CREAM | Freq: Two times a day (BID) | CUTANEOUS | 2 refills | Status: AC
  Filled 2023-02-27: qty 30, 30d supply, fill #0

## 2023-02-28 ENCOUNTER — Other Ambulatory Visit (HOSPITAL_COMMUNITY): Payer: Self-pay

## 2023-02-28 MED ORDER — ENALAPRIL MALEATE 10 MG PO TABS
10.0000 mg | ORAL_TABLET | Freq: Every day | ORAL | 3 refills | Status: DC
  Filled 2023-02-28: qty 90, 90d supply, fill #0
  Filled 2023-06-13: qty 90, 90d supply, fill #1
  Filled 2023-09-27 – 2023-09-28 (×3): qty 90, 90d supply, fill #2
  Filled 2023-12-17 – 2023-12-20 (×2): qty 90, 90d supply, fill #3

## 2023-03-10 DIAGNOSIS — I10 Essential (primary) hypertension: Secondary | ICD-10-CM | POA: Diagnosis not present

## 2023-04-09 DIAGNOSIS — I10 Essential (primary) hypertension: Secondary | ICD-10-CM | POA: Diagnosis not present

## 2023-04-10 ENCOUNTER — Other Ambulatory Visit (HOSPITAL_BASED_OUTPATIENT_CLINIC_OR_DEPARTMENT_OTHER): Payer: Self-pay

## 2023-04-10 MED ORDER — LEVOTHYROXINE SODIUM 75 MCG PO TABS
75.0000 ug | ORAL_TABLET | Freq: Every morning | ORAL | 3 refills | Status: DC
  Filled 2023-04-10: qty 90, 90d supply, fill #0
  Filled 2023-07-16: qty 90, 90d supply, fill #1
  Filled 2023-10-21: qty 90, 90d supply, fill #2
  Filled 2024-01-19: qty 90, 90d supply, fill #3

## 2023-05-10 DIAGNOSIS — I10 Essential (primary) hypertension: Secondary | ICD-10-CM | POA: Diagnosis not present

## 2023-06-10 DIAGNOSIS — I10 Essential (primary) hypertension: Secondary | ICD-10-CM | POA: Diagnosis not present

## 2023-06-13 ENCOUNTER — Other Ambulatory Visit (HOSPITAL_BASED_OUTPATIENT_CLINIC_OR_DEPARTMENT_OTHER): Payer: Self-pay

## 2023-07-10 DIAGNOSIS — I10 Essential (primary) hypertension: Secondary | ICD-10-CM | POA: Diagnosis not present

## 2023-07-16 ENCOUNTER — Other Ambulatory Visit: Payer: Self-pay

## 2023-07-24 ENCOUNTER — Other Ambulatory Visit: Payer: Self-pay | Admitting: Family Medicine

## 2023-07-24 DIAGNOSIS — Z1231 Encounter for screening mammogram for malignant neoplasm of breast: Secondary | ICD-10-CM

## 2023-07-31 ENCOUNTER — Ambulatory Visit
Admission: RE | Admit: 2023-07-31 | Discharge: 2023-07-31 | Disposition: A | Discharge status: Home / Self Care | Payer: BC Managed Care – PPO | Source: Ambulatory Visit | Attending: Family Medicine | Admitting: Family Medicine

## 2023-07-31 DIAGNOSIS — Z1231 Encounter for screening mammogram for malignant neoplasm of breast: Secondary | ICD-10-CM | POA: Diagnosis not present

## 2023-07-31 DIAGNOSIS — Z23 Encounter for immunization: Secondary | ICD-10-CM | POA: Diagnosis not present

## 2023-08-10 DIAGNOSIS — I10 Essential (primary) hypertension: Secondary | ICD-10-CM | POA: Diagnosis not present

## 2023-08-28 IMAGING — MG MM DIGITAL SCREENING BILAT W/ TOMO AND CAD
6 of 10 series · 6 of 30 positions shown · non-contrast
Comparison: Previous exam(s).

CLINICAL DATA: Screening.

EXAM:
DIGITAL SCREENING BILATERAL MAMMOGRAM WITH TOMOSYNTHESIS AND CAD
TECHNIQUE: Bilateral screening digital craniocaudal and mediolateral oblique
mammograms were obtained. Bilateral screening digital breast
tomosynthesis was performed. The images were evaluated with
computer-aided detection.

[L MLO synth-2D (1 of 2)]
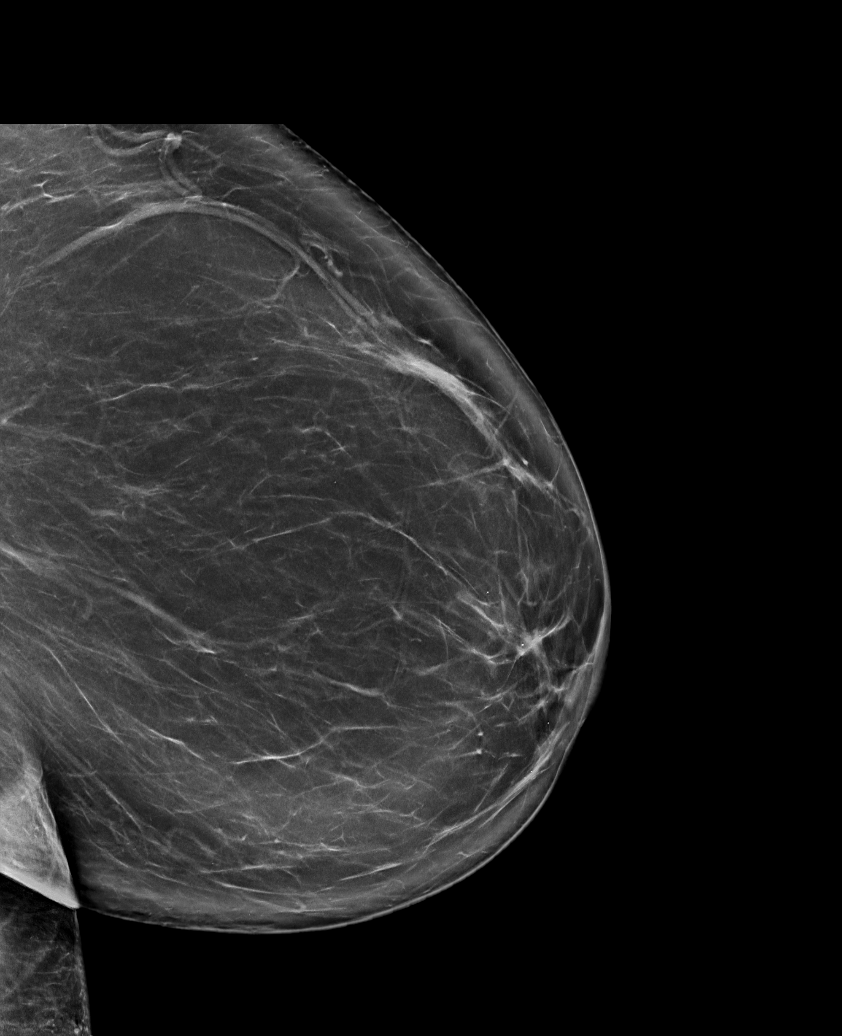

[L CC synth-2D]
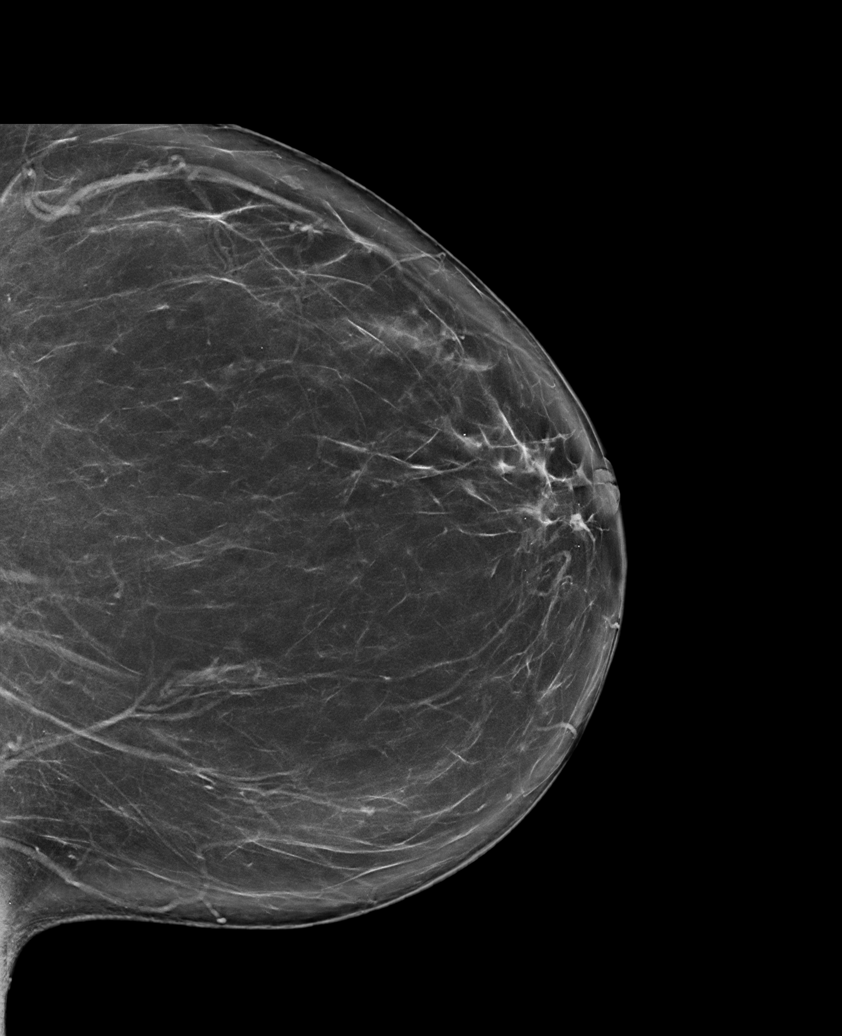

[L MLO synth-2D (2 of 2)]
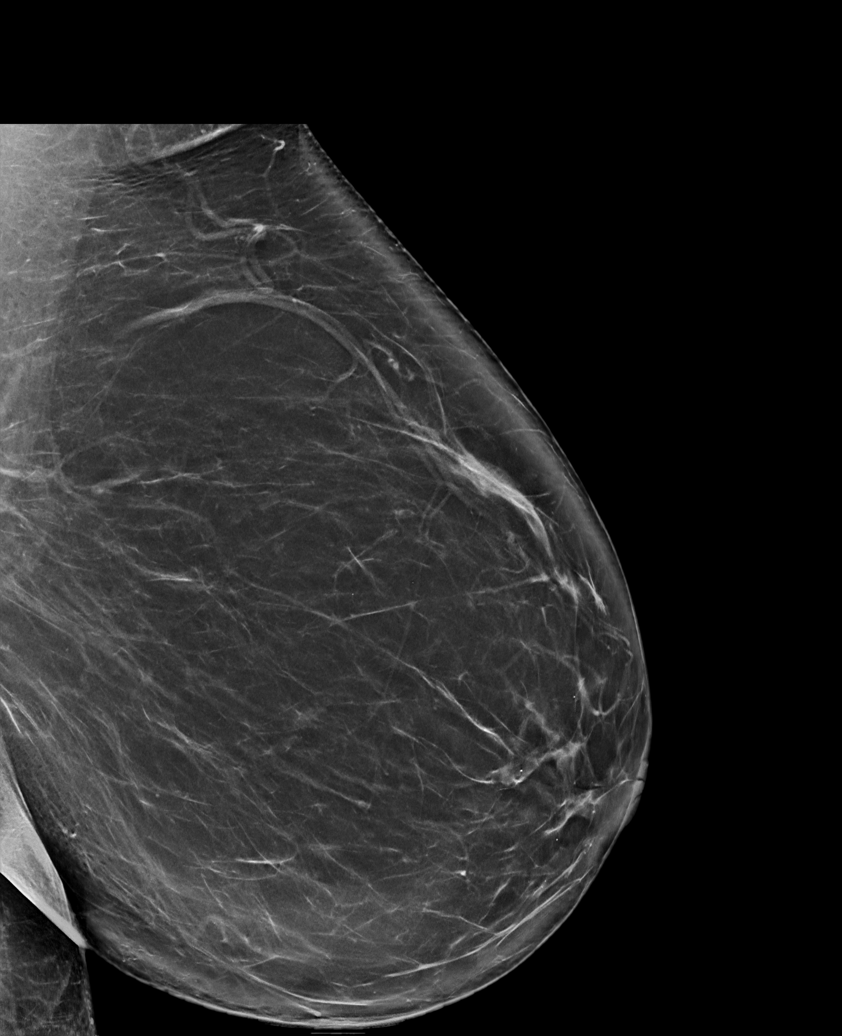

[R CC synth-2D]
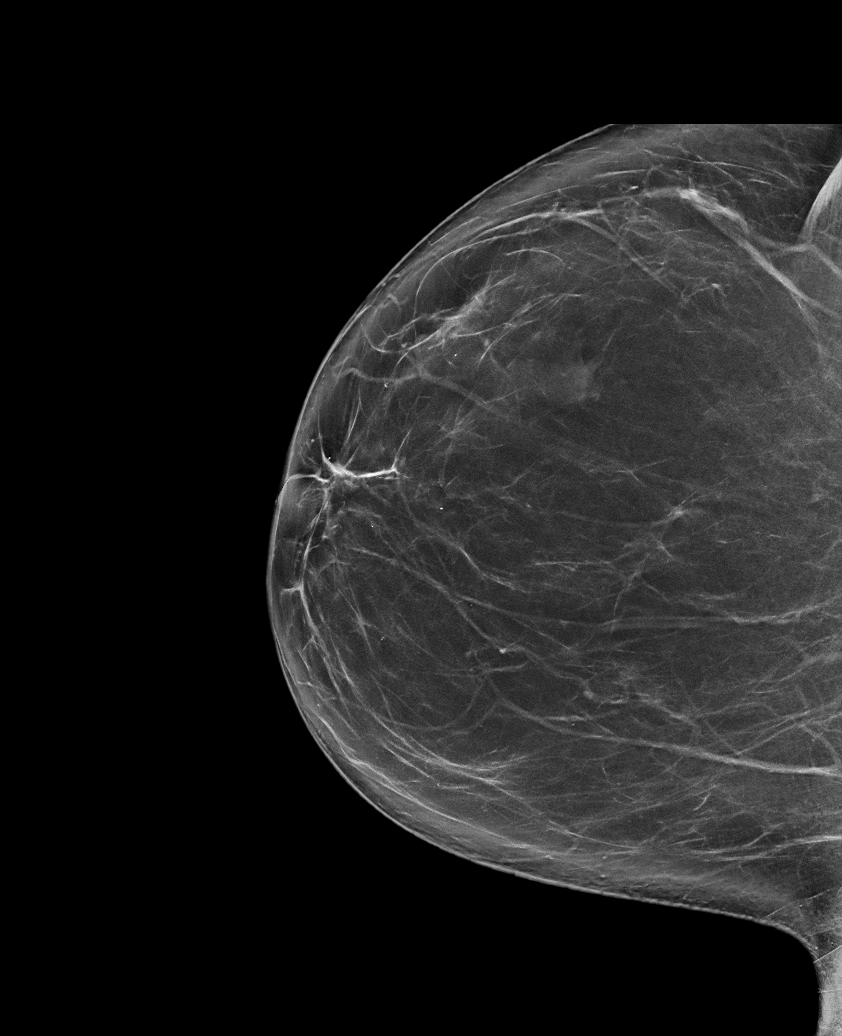

[R MLO synth-2D]
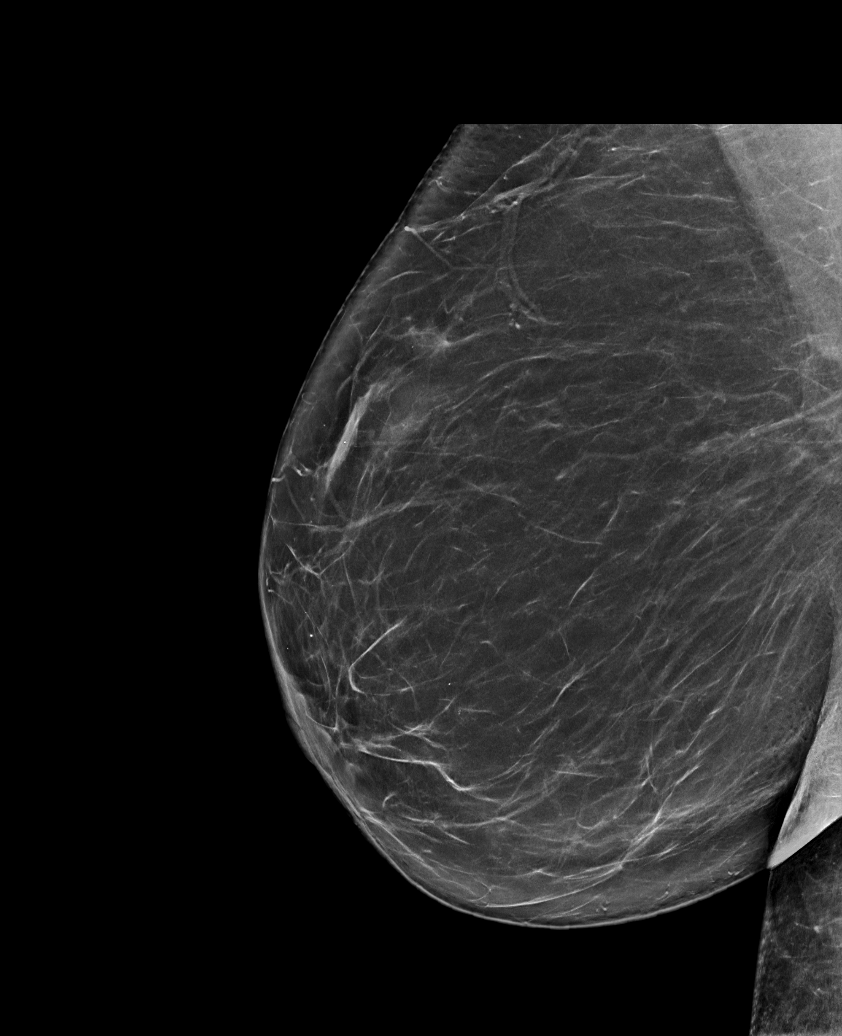

[R CC tomo · tomo slice 45/88.0]
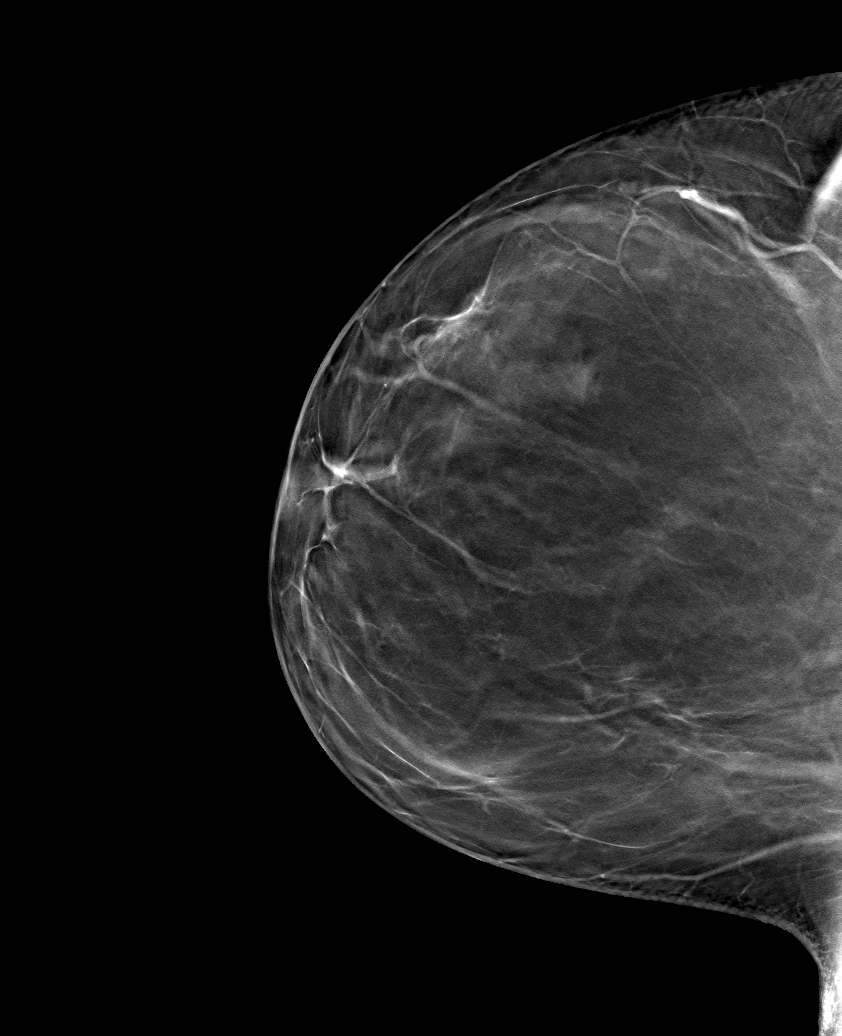

[6 of 30 positions shown; findings below may reference images not displayed]

ACR Breast Density Category b: There are scattered areas of
fibroglandular density.
FINDINGS: There are no findings suspicious for malignancy.
IMPRESSION: No mammographic evidence of malignancy. A result letter of this
screening mammogram will be mailed directly to the patient.

RECOMMENDATION:
Screening mammogram in one year. (Code:51-O-LD2)

BI-RADS CATEGORY  1: Negative.

## 2023-09-09 DIAGNOSIS — I10 Essential (primary) hypertension: Secondary | ICD-10-CM | POA: Diagnosis not present

## 2023-09-28 ENCOUNTER — Other Ambulatory Visit (HOSPITAL_BASED_OUTPATIENT_CLINIC_OR_DEPARTMENT_OTHER): Payer: Self-pay

## 2023-09-28 ENCOUNTER — Other Ambulatory Visit: Payer: Self-pay

## 2023-10-04 ENCOUNTER — Other Ambulatory Visit (HOSPITAL_BASED_OUTPATIENT_CLINIC_OR_DEPARTMENT_OTHER): Payer: Self-pay

## 2023-10-04 DIAGNOSIS — J069 Acute upper respiratory infection, unspecified: Secondary | ICD-10-CM | POA: Diagnosis not present

## 2023-10-04 MED ORDER — BENZONATATE 200 MG PO CAPS
200.0000 mg | ORAL_CAPSULE | Freq: Three times a day (TID) | ORAL | 0 refills | Status: AC | PRN
  Filled 2023-10-04: qty 45, 15d supply, fill #0

## 2023-10-10 DIAGNOSIS — I10 Essential (primary) hypertension: Secondary | ICD-10-CM | POA: Diagnosis not present

## 2023-10-22 ENCOUNTER — Other Ambulatory Visit (HOSPITAL_BASED_OUTPATIENT_CLINIC_OR_DEPARTMENT_OTHER): Payer: Self-pay

## 2023-10-23 ENCOUNTER — Other Ambulatory Visit (HOSPITAL_BASED_OUTPATIENT_CLINIC_OR_DEPARTMENT_OTHER): Payer: Self-pay

## 2023-10-23 DIAGNOSIS — J40 Bronchitis, not specified as acute or chronic: Secondary | ICD-10-CM | POA: Diagnosis not present

## 2023-10-23 DIAGNOSIS — M545 Low back pain, unspecified: Secondary | ICD-10-CM | POA: Diagnosis not present

## 2023-10-23 DIAGNOSIS — M546 Pain in thoracic spine: Secondary | ICD-10-CM | POA: Diagnosis not present

## 2023-10-23 MED ORDER — AZITHROMYCIN 250 MG PO TABS
ORAL_TABLET | ORAL | 0 refills | Status: AC
  Filled 2023-10-23: qty 6, 5d supply, fill #0

## 2023-10-23 MED ORDER — CYCLOBENZAPRINE HCL 10 MG PO TABS
10.0000 mg | ORAL_TABLET | Freq: Every evening | ORAL | 0 refills | Status: AC | PRN
  Filled 2023-10-23: qty 15, 15d supply, fill #0

## 2023-10-29 DIAGNOSIS — H53453 Other localized visual field defect, bilateral: Secondary | ICD-10-CM | POA: Diagnosis not present

## 2023-11-10 DIAGNOSIS — I10 Essential (primary) hypertension: Secondary | ICD-10-CM | POA: Diagnosis not present

## 2023-12-05 DIAGNOSIS — H53453 Other localized visual field defect, bilateral: Secondary | ICD-10-CM | POA: Diagnosis not present

## 2023-12-05 DIAGNOSIS — H35033 Hypertensive retinopathy, bilateral: Secondary | ICD-10-CM | POA: Diagnosis not present

## 2023-12-10 DIAGNOSIS — I10 Essential (primary) hypertension: Secondary | ICD-10-CM | POA: Diagnosis not present

## 2023-12-20 ENCOUNTER — Other Ambulatory Visit (HOSPITAL_BASED_OUTPATIENT_CLINIC_OR_DEPARTMENT_OTHER): Payer: Self-pay

## 2023-12-20 ENCOUNTER — Other Ambulatory Visit (HOSPITAL_COMMUNITY): Payer: Self-pay

## 2023-12-27 DIAGNOSIS — Z131 Encounter for screening for diabetes mellitus: Secondary | ICD-10-CM | POA: Diagnosis not present

## 2023-12-27 DIAGNOSIS — I10 Essential (primary) hypertension: Secondary | ICD-10-CM | POA: Diagnosis not present

## 2023-12-27 DIAGNOSIS — Z0000 Encounter for general adult medical examination without abnormal findings: Secondary | ICD-10-CM | POA: Diagnosis not present

## 2023-12-27 DIAGNOSIS — Z23 Encounter for immunization: Secondary | ICD-10-CM | POA: Diagnosis not present

## 2023-12-27 DIAGNOSIS — E7800 Pure hypercholesterolemia, unspecified: Secondary | ICD-10-CM | POA: Diagnosis not present

## 2023-12-27 DIAGNOSIS — E039 Hypothyroidism, unspecified: Secondary | ICD-10-CM | POA: Diagnosis not present

## 2023-12-29 ENCOUNTER — Other Ambulatory Visit (HOSPITAL_BASED_OUTPATIENT_CLINIC_OR_DEPARTMENT_OTHER): Payer: Self-pay | Admitting: Family Medicine

## 2023-12-29 DIAGNOSIS — E7800 Pure hypercholesterolemia, unspecified: Secondary | ICD-10-CM

## 2024-01-09 DIAGNOSIS — I10 Essential (primary) hypertension: Secondary | ICD-10-CM | POA: Diagnosis not present

## 2024-01-16 DIAGNOSIS — H35033 Hypertensive retinopathy, bilateral: Secondary | ICD-10-CM | POA: Diagnosis not present

## 2024-01-16 DIAGNOSIS — H53453 Other localized visual field defect, bilateral: Secondary | ICD-10-CM | POA: Diagnosis not present

## 2024-01-19 ENCOUNTER — Other Ambulatory Visit (HOSPITAL_BASED_OUTPATIENT_CLINIC_OR_DEPARTMENT_OTHER): Payer: Self-pay

## 2024-01-20 ENCOUNTER — Other Ambulatory Visit: Payer: Self-pay | Admitting: Medical Genetics

## 2024-01-21 ENCOUNTER — Other Ambulatory Visit: Payer: Self-pay

## 2024-01-21 ENCOUNTER — Other Ambulatory Visit (HOSPITAL_BASED_OUTPATIENT_CLINIC_OR_DEPARTMENT_OTHER): Payer: Self-pay

## 2024-01-21 MED ORDER — BISACODYL 5 MG PO TBEC
DELAYED_RELEASE_TABLET | ORAL | 0 refills | Status: AC
  Filled 2024-01-21: qty 4, 1d supply, fill #0

## 2024-01-21 MED ORDER — PEG 3350-KCL-NA BICARB-NACL 420 G PO SOLR
ORAL | 0 refills | Status: AC
  Filled 2024-01-21: qty 4000, 1d supply, fill #0

## 2024-01-22 ENCOUNTER — Other Ambulatory Visit (HOSPITAL_BASED_OUTPATIENT_CLINIC_OR_DEPARTMENT_OTHER): Payer: Self-pay

## 2024-01-22 MED ORDER — HYDROCHLOROTHIAZIDE 25 MG PO TABS
25.0000 mg | ORAL_TABLET | Freq: Every day | ORAL | 2 refills | Status: DC
  Filled 2024-01-22: qty 90, 90d supply, fill #0
  Filled 2024-04-17: qty 90, 90d supply, fill #1
  Filled 2024-08-02: qty 90, 90d supply, fill #2

## 2024-01-25 ENCOUNTER — Other Ambulatory Visit
Admission: RE | Admit: 2024-01-25 | Discharge: 2024-01-25 | Disposition: A | Discharge status: Home / Self Care | Payer: Self-pay | Source: Ambulatory Visit | Attending: Medical Genetics | Admitting: Medical Genetics

## 2024-01-30 DIAGNOSIS — Z1211 Encounter for screening for malignant neoplasm of colon: Secondary | ICD-10-CM | POA: Diagnosis not present

## 2024-01-30 DIAGNOSIS — K5730 Diverticulosis of large intestine without perforation or abscess without bleeding: Secondary | ICD-10-CM | POA: Diagnosis not present

## 2024-02-05 LAB — GENECONNECT MOLECULAR SCREEN: Genetic Analysis Overall Interpretation: NEGATIVE

## 2024-02-08 DIAGNOSIS — I10 Essential (primary) hypertension: Secondary | ICD-10-CM | POA: Diagnosis not present

## 2024-02-13 ENCOUNTER — Ambulatory Visit (HOSPITAL_BASED_OUTPATIENT_CLINIC_OR_DEPARTMENT_OTHER)
Admission: RE | Admit: 2024-02-13 | Discharge: 2024-02-13 | Disposition: A | Discharge status: Home / Self Care | Payer: Self-pay | Source: Ambulatory Visit | Attending: Family Medicine | Admitting: Family Medicine

## 2024-02-13 DIAGNOSIS — E7800 Pure hypercholesterolemia, unspecified: Secondary | ICD-10-CM | POA: Insufficient documentation

## 2024-03-06 ENCOUNTER — Other Ambulatory Visit (HOSPITAL_BASED_OUTPATIENT_CLINIC_OR_DEPARTMENT_OTHER): Payer: Self-pay

## 2024-03-06 MED ORDER — ATORVASTATIN CALCIUM 10 MG PO TABS
10.0000 mg | ORAL_TABLET | Freq: Every day | ORAL | 11 refills | Status: AC
  Filled 2024-03-06: qty 30, 30d supply, fill #0
  Filled 2024-04-08: qty 30, 30d supply, fill #1
  Filled 2024-05-04: qty 30, 30d supply, fill #2
  Filled 2024-06-09: qty 30, 30d supply, fill #3
  Filled 2024-07-13: qty 30, 30d supply, fill #4
  Filled 2024-08-11: qty 30, 30d supply, fill #5
  Filled 2024-09-16: qty 30, 30d supply, fill #6
  Filled 2024-10-18: qty 30, 30d supply, fill #7
  Filled 2024-11-12 (×2): qty 90, 90d supply, fill #0

## 2024-03-09 DIAGNOSIS — I10 Essential (primary) hypertension: Secondary | ICD-10-CM | POA: Diagnosis not present

## 2024-04-09 ENCOUNTER — Other Ambulatory Visit (HOSPITAL_BASED_OUTPATIENT_CLINIC_OR_DEPARTMENT_OTHER): Payer: Self-pay

## 2024-04-09 MED ORDER — ENALAPRIL MALEATE 10 MG PO TABS
10.0000 mg | ORAL_TABLET | Freq: Every day | ORAL | 2 refills | Status: AC
  Filled 2024-04-09: qty 90, 90d supply, fill #0
  Filled 2024-07-13: qty 90, 90d supply, fill #1
  Filled 2024-10-18: qty 90, 90d supply, fill #2

## 2024-04-17 ENCOUNTER — Other Ambulatory Visit: Payer: Self-pay

## 2024-04-17 ENCOUNTER — Other Ambulatory Visit (HOSPITAL_BASED_OUTPATIENT_CLINIC_OR_DEPARTMENT_OTHER): Payer: Self-pay

## 2024-04-18 ENCOUNTER — Other Ambulatory Visit (HOSPITAL_BASED_OUTPATIENT_CLINIC_OR_DEPARTMENT_OTHER): Payer: Self-pay

## 2024-04-18 ENCOUNTER — Other Ambulatory Visit: Payer: Self-pay

## 2024-04-18 MED ORDER — LEVOTHYROXINE SODIUM 75 MCG PO TABS
75.0000 ug | ORAL_TABLET | Freq: Every morning | ORAL | 2 refills | Status: AC
  Filled 2024-04-18: qty 90, 90d supply, fill #0
  Filled 2024-08-02: qty 90, 90d supply, fill #1
  Filled 2024-11-05: qty 90, 90d supply, fill #2

## 2024-04-19 ENCOUNTER — Other Ambulatory Visit (HOSPITAL_BASED_OUTPATIENT_CLINIC_OR_DEPARTMENT_OTHER): Payer: Self-pay

## 2024-05-05 ENCOUNTER — Other Ambulatory Visit (HOSPITAL_BASED_OUTPATIENT_CLINIC_OR_DEPARTMENT_OTHER): Payer: Self-pay

## 2024-06-09 ENCOUNTER — Other Ambulatory Visit (HOSPITAL_BASED_OUTPATIENT_CLINIC_OR_DEPARTMENT_OTHER): Payer: Self-pay

## 2024-06-09 ENCOUNTER — Other Ambulatory Visit: Payer: Self-pay

## 2024-06-10 ENCOUNTER — Other Ambulatory Visit (HOSPITAL_BASED_OUTPATIENT_CLINIC_OR_DEPARTMENT_OTHER): Payer: Self-pay

## 2024-07-13 ENCOUNTER — Other Ambulatory Visit (HOSPITAL_BASED_OUTPATIENT_CLINIC_OR_DEPARTMENT_OTHER): Payer: Self-pay

## 2024-07-22 ENCOUNTER — Other Ambulatory Visit: Payer: Self-pay | Admitting: Family Medicine

## 2024-07-22 DIAGNOSIS — Z1231 Encounter for screening mammogram for malignant neoplasm of breast: Secondary | ICD-10-CM

## 2024-08-08 ENCOUNTER — Ambulatory Visit
Admission: RE | Admit: 2024-08-08 | Discharge: 2024-08-08 | Disposition: A | Discharge status: Home / Self Care | Source: Ambulatory Visit | Attending: Family Medicine | Admitting: Family Medicine

## 2024-08-08 DIAGNOSIS — Z1231 Encounter for screening mammogram for malignant neoplasm of breast: Secondary | ICD-10-CM

## 2024-08-11 ENCOUNTER — Other Ambulatory Visit (HOSPITAL_BASED_OUTPATIENT_CLINIC_OR_DEPARTMENT_OTHER): Payer: Self-pay

## 2024-08-14 ENCOUNTER — Other Ambulatory Visit (HOSPITAL_BASED_OUTPATIENT_CLINIC_OR_DEPARTMENT_OTHER): Payer: Self-pay

## 2024-08-14 MED ORDER — NAPROXEN 500 MG PO TABS
500.0000 mg | ORAL_TABLET | Freq: Two times a day (BID) | ORAL | 0 refills | Status: AC
  Filled 2024-08-14: qty 14, 7d supply, fill #0

## 2024-09-16 ENCOUNTER — Other Ambulatory Visit (HOSPITAL_BASED_OUTPATIENT_CLINIC_OR_DEPARTMENT_OTHER): Payer: Self-pay

## 2024-10-22 ENCOUNTER — Other Ambulatory Visit (HOSPITAL_BASED_OUTPATIENT_CLINIC_OR_DEPARTMENT_OTHER): Payer: Self-pay

## 2024-11-05 ENCOUNTER — Other Ambulatory Visit (HOSPITAL_BASED_OUTPATIENT_CLINIC_OR_DEPARTMENT_OTHER): Payer: Self-pay

## 2024-11-06 ENCOUNTER — Other Ambulatory Visit (HOSPITAL_BASED_OUTPATIENT_CLINIC_OR_DEPARTMENT_OTHER): Payer: Self-pay

## 2024-11-06 ENCOUNTER — Other Ambulatory Visit: Payer: Self-pay

## 2024-11-06 MED ORDER — HYDROCHLOROTHIAZIDE 25 MG PO TABS
25.0000 mg | ORAL_TABLET | Freq: Every day | ORAL | 1 refills | Status: AC
  Filled 2024-11-06 – 2024-11-12 (×2): qty 90, 90d supply, fill #0

## 2024-11-11 ENCOUNTER — Other Ambulatory Visit (HOSPITAL_BASED_OUTPATIENT_CLINIC_OR_DEPARTMENT_OTHER): Payer: Self-pay

## 2024-11-12 ENCOUNTER — Other Ambulatory Visit (HOSPITAL_BASED_OUTPATIENT_CLINIC_OR_DEPARTMENT_OTHER): Payer: Self-pay

## 2024-11-12 ENCOUNTER — Other Ambulatory Visit (HOSPITAL_COMMUNITY): Payer: Self-pay

## 2024-11-13 ENCOUNTER — Other Ambulatory Visit: Payer: Self-pay
